# Patient Record
Sex: Male | Born: 1970
Health system: Southern US, Community
[De-identification: ages and names within clinical notes are randomized; demographics above are authoritative.]

## PROBLEM LIST (undated history)

## (undated) DIAGNOSIS — T7840XA Allergy, unspecified, initial encounter: Secondary | ICD-10-CM

## (undated) HISTORY — DX: Allergy, unspecified, initial encounter: T78.40XA

## (undated) HISTORY — PX: VASECTOMY: SHX75

## (undated) HISTORY — PX: SHOULDER SURGERY: SHX246

---

## 2010-07-10 ENCOUNTER — Ambulatory Visit: Payer: Self-pay | Admitting: Sports Medicine

## 2010-07-10 ENCOUNTER — Encounter: Payer: Self-pay | Admitting: Sports Medicine

## 2010-07-10 DIAGNOSIS — M79609 Pain in unspecified limb: Secondary | ICD-10-CM

## 2010-07-10 DIAGNOSIS — Q667 Congenital pes cavus, unspecified foot: Secondary | ICD-10-CM

## 2010-07-10 DIAGNOSIS — M722 Plantar fascial fibromatosis: Secondary | ICD-10-CM | POA: Insufficient documentation

## 2010-07-20 ENCOUNTER — Ambulatory Visit: Payer: Self-pay | Admitting: Sports Medicine

## 2011-01-09 NOTE — Assessment & Plan Note (Signed)
Summary: ORTHOTICS,MC   Vital Signs:  Patient profile:   40 year old male BP sitting:   133 / 85  Vitals Entered By: Lillia Pauls CMA (July 20, 2010 2:17 PM)  History of Present Illness: Brett Cook returns today On last vist we found his chronic PF was very thick on left using arch strap and this has helped doing stretches, exercises and ice wtih some improvement  pain is down another 30%  with his high cavus foot we asked him to RTC for orthotics to see if we can get him back to running  OK on elliptical  Physical Exam  General:  Well-developed,well-nourished,in no acute distress; alert,appropriate and cooperative throughout examination Msk:  HIgh Cavus foot bial Less TTP on left gati shows less foot turnout on left now that he has done exercises midfoot strike with slt forefoot varus on gait   Impression & Recommendations:  Problem # 1:  FOOT PAIN, BILATERAL (ICD-729.5)  improving with Tx plan  Orders: [employer], each unit (L3002)  RTC 2 mos and repeat US  Problem # 2:  PLANTAR FASCIITIS (ICD-728.71)  Patient was fitted for a standard, cushioned, semi-rigid orthotic.  The orthotic was heated and the patient stood on the orthotic blank positioned on the orthotic stand. The patient was positioned in subtalar neutral position and 10 degrees of ankle dorsiflexion in a weight bearing stance. After completion of molding a stable based was applied to the orthotic blank.   The blank was ground to a stable position for weight bearing. size 11 blue swirl base med density large EVA posting none additional orthotic padding none  time 30 mins  use orthotics in workout shoes p 2 wks consider easing into some running  keep up std stretches, exercise and icing  Orders: [employer], each unit (Y7829)  Problem # 3:  TALIPES CAVUS (ICD-754.71)  considering his arch height use other arch supports in all dress shoes  Orders: [employer], each unit  [phone])

## 2011-01-09 NOTE — Assessment & Plan Note (Signed)
Summary: NP-Plantar Fasciitis/bilateral heel pain    Vital Signs:  Patient profile:   40 year old male Height:      72 inches Weight:      221.50 pounds BMI:     30.15 Pulse rate:   65 / minute BP sitting:   135 / 91  (left arm)  Vitals Entered By: Terese Door (July 10, 2010 9:04 AM)  CC: NP-DX with Plantar Fasciitis by Dr. Kendall/Bilateral heel pain      Appended Document: NP-Plantar Fasciitis/bilateral heel pain     History of Present Illness: Brett Cook is a 40 year old male who presents today with a complaint plantar fasciitis, begining in his left foot and now in his right. He has tried conservative therapy including stretches, rolling on ice, NSAIDs, a night boot, Dr. Margart Sickles heel inserts without significant improvement in symptoms. He complains of significant pain with his first steps in the morning and after immobilization in the car or at his desk. He complains of some increasing pain in the region of the fifth left metatarsal region.  Brett Cook was previously an avid distance runner, climber, and skiier, but now limits his physical activity to the elipitical and and bike at the gym (4 times/week) due to pain. He works at a Health and safety inspector job, doing Education officer, environmental for [employer]. He has put on 10 pounds over the past six months due to decreased physical activity.  Past History:  Past Surgical History: Bilateral rotator cuff repair  Physical Exam  General:  Well-developed,well-nourished,in no acute distress; alert,appropriate and cooperative throughout examination Msk:  Long arches are 4cm high, bilaterally, without collapsing.  Tenderness to palpation of the area of plantar fascia insertion into calcaneous, bilaterally.  Walking gait shows normal stride and rhythm.  Running gait shows some eversion and external rotation of the left foot, favoring the lateral aspect.  Slight tenderness to palpation over the left fifth metatarsal without evidence of erythema or  edema. Additional Exam:  MSK [place] There is significant thickening of PF at insertion on Left measuring 0.86 cms There is also some caldification at bone edge RT the PF is mod thickened at 0.67 slight inc calcification as well  images saved   Impression & Recommendations:  Problem # 1:  FOOT PAIN, BILATERAL (ICD-729.5)  this has been chronic now 6 mos  not much relief w std PF program as prescribed by Dr Penni Bombard  Orders: [place] LIMITED [phone])  Problem # 2:  PLANTAR FASCIITIS (ICD-728.71)  This is clearly evident on [place]  prob need to try arch straps as well as set up for custom orthotics  given std PF series of stretches and exercises  Orders: [place] LIMITED ([id number])  Problem # 3:  TALIPES CAVUS (ICD-754.71) these are so high I think we need to get him into custom support for future sports  will rTC in 1 mo  or less for orthotics

## 2012-05-02 ENCOUNTER — Encounter: Payer: Self-pay | Admitting: Family Medicine

## 2012-12-25 ENCOUNTER — Ambulatory Visit (INDEPENDENT_AMBULATORY_CARE_PROVIDER_SITE_OTHER): Payer: BC Managed Care – PPO | Admitting: Emergency Medicine

## 2012-12-25 VITALS — BP 138/89 | HR 89 | Temp 98.1°F | Resp 18 | Wt 230.0 lb

## 2012-12-25 DIAGNOSIS — J020 Streptococcal pharyngitis: Secondary | ICD-10-CM

## 2012-12-25 MED ORDER — AZITHROMYCIN 250 MG PO TABS: ORAL_TABLET | ORAL | Status: DC

## 2012-12-25 NOTE — Progress Notes (Signed)
[facility] [address], [place] [address] [phone]  Date:  12/25/2012   Name:  Brett Cook   DOB:  [birth year]   MRN:  [id number]  PCP:  No primary provider on file.    Chief Complaint: Sore Throat and Fever   History of Present Illness:  [name] is a 42 y.o. very pleasant male patient who presents with the following:  Ill since yesterday with fatigue, sore throat, myalgias, and fever.  Daughter has strep.  No nausea or vomiting.  No cough or coryza.  No wheezing or shortness of breath.  Patient Active Problem List  Diagnosis  . PLANTAR FASCIITIS  . FOOT PAIN, BILATERAL  . TALIPES CAVUS    No past medical history on file.  No past surgical history on file.  History  Substance Use Topics  . Smoking status: Never Smoker   . Smokeless tobacco: Not on file  . Alcohol Use: Not on file    No family history on file.  Allergies  Allergen Reactions  . Penicillins     Medication list has been reviewed and updated.  No current outpatient prescriptions on file prior to visit.    Review of Systems:  As per HPI, otherwise negative.    Physical Examination: Filed Vitals:   12/25/12 1159  BP: 138/89  Pulse: 89  Temp: 98.1 F (36.7 C)  Resp: 18   Filed Vitals:   12/25/12 1159  Weight: 230 lb (104.327 kg)   There is no height on file to calculate BMI. Ideal Body Weight:    GEN: WDWN, NAD, Non-toxic, A & O x 3 HEENT: Atraumatic, Normocephalic. Neck supple. No masses, No LAD.  Oropharynx erythematous Ears and Nose: No external deformity. CV: RRR, No M/G/R. No JVD. No thrill. No extra heart sounds. PULM: CTA B, no wheezes, crackles, rhonchi. No retractions. No resp. distress. No accessory muscle use. ABD: S, NT, ND, +BS. No rebound. No HSM. EXTR: No c/c/e NEURO Normal gait.  PSYCH: Normally interactive. Conversant. Not depressed or anxious appearing.  Calm demeanor.    Assessment and Plan: Strep  throat zithromax Follow up as needed  Carmelina Dane, MD

## 2013-12-05 ENCOUNTER — Ambulatory Visit (INDEPENDENT_AMBULATORY_CARE_PROVIDER_SITE_OTHER): Payer: BC Managed Care – PPO | Admitting: Internal Medicine

## 2013-12-05 VITALS — BP 120/80 | HR 76 | Temp 98.4°F | Resp 16 | Ht 73.0 in | Wt 231.0 lb

## 2013-12-05 DIAGNOSIS — H109 Unspecified conjunctivitis: Secondary | ICD-10-CM

## 2013-12-05 MED ORDER — GENTAMICIN SULFATE 0.3 % OP SOLN: 2.0000 [drp] | OPHTHALMIC | Status: DC

## 2013-12-05 NOTE — Progress Notes (Addendum)
Subjective:  This chart was scribed for Brett Sia, MD by Carl Best, Medical Scribe. This patient was seen in Room 8 and the patient's care was started at 6:35 PM.   Patient ID: Brett Cook, male    DOB: 1970/12/23, 42 y.o.   MRN: [id number]  HPI HPI Comments: Brett Cook is a 42 y.o. male who presents to the [facility] complaining of right eye conjunctivitis.  He states that he had right eye discharge and irritation last night.  He states that his left eye is starting to irritate him now.  He denies sinus pressure and blurred vision as associated symptoms.  He states that he experienced mild sore throat and mild rhinorrhea two days ago.  He states that he used OTC eye drops for his symptoms with no relief.  He states that his wife and daughter had mild conjunctivitis last week.  He states that he is outdoors often this time of the year.     Past Medical History  Diagnosis Date  . Allergy    Past Surgical History  Procedure Laterality Date  . Shoulder surgery     Family History  Problem Relation Age of Onset  . Healthy Mother   . Heart disease Father    History   Social History  . Marital Status: Married    Spouse Name: N/A    Number of Children: N/A  . Years of Education: N/A   Occupational History  . Not on file.   Social History Main Topics  . Smoking status: Never Smoker   . Smokeless tobacco: Not on file  . Alcohol Use: Not on file  . Drug Use: Not on file  . Sexual Activity: Not on file   Other Topics Concern  . Not on file   Social History Narrative  . No narrative on file   Allergies  Allergen Reactions  . Penicillins     Review of Systems A complete 10 system review of systems was obtained and all systems are negative except as noted in the HPI and PMH.    Objective:  Physical Exam  Nursing note and vitals reviewed. Constitutional: He is oriented to person, place, and time. He appears well-developed and  well-nourished. No distress.  HENT:  Head: Normocephalic and atraumatic.  Eyes: Conjunctivae and EOM are normal. Pupils are equal, round, and reactive to light. Right eye exhibits discharge.  Conjunctiva on the right is injected with crusty discharge and lower lid edema but no evidence of abscess  Neck: Neck supple.  Cardiovascular: Normal rate.   Pulmonary/Chest: Effort normal.  Lymphadenopathy:    He has no cervical adenopathy.  Neurological: He is alert and oriented to person, place, and time. No cranial nerve deficit.  Psychiatric: He has a normal mood and affect. His behavior is normal.     BP 120/80  Pulse 76  Temp(Src) 98.4 F (36.9 C) (Oral)  Resp 16  Ht 6\' 1"  (1.854 m)  Wt 231 lb (104.781 kg)  BMI 30.48 kg/m2  SpO2 97%  Assessment & Plan:   I personally performed the services described in this documentation, which was scribed in my presence. The recorded information has been reviewed and is accurate.  Conjunctivitis  Meds ordered this encounter  Medications  . gentamicin (GARAMYCIN) 0.3 % ophthalmic solution    Sig: Place 2 drops into the right eye every 4 (four) hours.    Dispense:  5 mL    Refill:  1

## 2016-03-18 ENCOUNTER — Ambulatory Visit (INDEPENDENT_AMBULATORY_CARE_PROVIDER_SITE_OTHER): Payer: BLUE CROSS/BLUE SHIELD | Admitting: Internal Medicine

## 2016-03-18 VITALS — BP 136/82 | HR 103 | Temp 98.9°F | Resp 20 | Ht 73.5 in | Wt 229.2 lb

## 2016-03-18 DIAGNOSIS — J020 Streptococcal pharyngitis: Secondary | ICD-10-CM | POA: Diagnosis not present

## 2016-03-18 LAB — POCT RAPID STREP A (OFFICE): Rapid Strep A Screen: POSITIVE — AB

## 2016-03-18 MED ORDER — AMOXICILLIN 875 MG PO TABS: 875.0000 mg | ORAL_TABLET | Freq: Two times a day (BID) | ORAL | Status: DC

## 2016-03-18 NOTE — Patient Instructions (Signed)
     IF you received an x-ray today, you will receive an invoice from [facility]. Please contact [facility] at [phone] with questions or concerns regarding your invoice.   IF you received labwork today, you will receive an invoice from [facility]. Please contact [facility] at [phone] with questions or concerns regarding your invoice.   Our billing staff will not be able to assist you with questions regarding bills from these companies.  You will be contacted with the lab results as soon as they are available. The fastest way to get your results is to activate your My Chart account. Instructions are located on the last page of this paperwork. If you have not heard from us regarding the results in 2 weeks, please contact this office.      

## 2016-03-18 NOTE — Progress Notes (Signed)
Subjective:  By signing my name below, I, [name], attest that this documentation has been prepared under the direction and in the presence of Ellamae Siaobert Isaish Alemu, MD. Electronically Signed: [name], Scribe. 03/18/2016 , 10:59 AM .  Patient was seen in Room 9 .   Patient ID: Brett Cook, male    DOB: 10/05/1971, 45 y.o.   MRN: [id number] Chief Complaint  Patient presents with  . Sore Throat    started over night.  painful to swallow  . Sinusitis    sinus pressure that started yesterday   HPI Brett Cook is a 45 y.o. male who presents to [facility] complaining of sore throat and sinus pressure that started last night. He mentions his children's classmates having strep. He also informs being out in the woods with his son yesterday afternoon. He does have seasonal allergies. He noticed rhinorrhea and dry eyes yesterday afternoon. Today, he's noticed cervical lymph nodes feeling swollen and pain when swallowing. He states taking a 20 minute hot shower this morning for nasal congestion relief. He denies fever or cough.   Patient Active Problem List   Diagnosis Date Noted  . PLANTAR FASCIITIS 07/10/2010  . FOOT PAIN, BILATERAL 07/10/2010  . TALIPES CAVUS 07/10/2010    Current outpatient prescriptions:  .  gentamicin (GARAMYCIN) 0.3 % ophthalmic solution, Place 2 drops into the right eye every 4 (four) hours. (Patient not taking: Reported on 03/18/2016), Disp: 5 mL, Rfl: 1  Review of Systems  Constitutional: Negative for fever, chills and fatigue.  HENT: Positive for congestion, rhinorrhea, sinus pressure, sore throat and trouble swallowing.   Eyes:       Eye dryness  Respiratory: Negative for cough.   Gastrointestinal: Negative for nausea, vomiting and diarrhea.  Allergic/Immunologic: Positive for environmental allergies.  Hematological: Positive for adenopathy.      Objective:   Physical Exam  Constitutional: He is oriented to person, place, and time. He appears  well-developed and well-nourished. No distress.  HENT:  Head: Normocephalic and atraumatic.  Nose: Rhinorrhea (purulent) present.  Mouth/Throat: Oropharyngeal exudate (scant) and posterior oropharyngeal erythema present.  Eyes: EOM are normal. Pupils are equal, round, and reactive to light.  Neck: Neck supple.  Cardiovascular: Normal rate.   Pulmonary/Chest: Effort normal and breath sounds normal. No respiratory distress. He has no wheezes.  Musculoskeletal: Normal range of motion.  Lymphadenopathy:    He has cervical adenopathy (anterior).  Neurological: He is alert and oriented to person, place, and time.  Skin: Skin is warm and dry.  Psychiatric: He has a normal mood and affect. His behavior is normal.  Nursing note and vitals reviewed.  BP 136/82 mmHg  Pulse 103  Temp(Src) 98.9 F (37.2 C) (Oral)  Resp 20  Ht 6' 1.5" (1.867 m)  Wt 229 lb 4 oz (103.987 kg)  BMI 29.83 kg/m2  SpO2 98%  Results for orders placed or performed in visit on 03/18/16  POCT rapid strep A  Result Value Ref Range   Rapid Strep A Screen Positive (A) Negative      Assessment & Plan:  I have completed the patient encounter in its entirety as documented by the scribe, with editing by me where necessary. Machael Raine P. Merla Richesoolittle, M.D. Strep pharyngitis - Plan: POCT rapid strep A  Meds ordered this encounter  Medications  . amoxicillin (AMOXIL) 875 MG tablet    Sig: Take 1 tablet (875 mg total) by mouth 2 (two) times daily.    Dispense:  20 tablet  Refill:  0   Allergic to Pen but tolerates amox without trouble

## 2018-07-14 DIAGNOSIS — M542 Cervicalgia: Secondary | ICD-10-CM | POA: Diagnosis not present

## 2018-07-14 DIAGNOSIS — M9903 Segmental and somatic dysfunction of lumbar region: Secondary | ICD-10-CM | POA: Diagnosis not present

## 2018-07-14 DIAGNOSIS — M5412 Radiculopathy, cervical region: Secondary | ICD-10-CM | POA: Diagnosis not present

## 2018-07-14 DIAGNOSIS — M9901 Segmental and somatic dysfunction of cervical region: Secondary | ICD-10-CM | POA: Diagnosis not present

## 2018-07-21 DIAGNOSIS — M9903 Segmental and somatic dysfunction of lumbar region: Secondary | ICD-10-CM | POA: Diagnosis not present

## 2018-07-21 DIAGNOSIS — M542 Cervicalgia: Secondary | ICD-10-CM | POA: Diagnosis not present

## 2018-07-21 DIAGNOSIS — M9901 Segmental and somatic dysfunction of cervical region: Secondary | ICD-10-CM | POA: Diagnosis not present

## 2018-07-21 DIAGNOSIS — M5412 Radiculopathy, cervical region: Secondary | ICD-10-CM | POA: Diagnosis not present

## 2018-07-23 DIAGNOSIS — M9903 Segmental and somatic dysfunction of lumbar region: Secondary | ICD-10-CM | POA: Diagnosis not present

## 2018-07-23 DIAGNOSIS — M9901 Segmental and somatic dysfunction of cervical region: Secondary | ICD-10-CM | POA: Diagnosis not present

## 2018-07-23 DIAGNOSIS — M5412 Radiculopathy, cervical region: Secondary | ICD-10-CM | POA: Diagnosis not present

## 2018-07-23 DIAGNOSIS — M542 Cervicalgia: Secondary | ICD-10-CM | POA: Diagnosis not present

## 2018-07-30 DIAGNOSIS — M9901 Segmental and somatic dysfunction of cervical region: Secondary | ICD-10-CM | POA: Diagnosis not present

## 2018-07-30 DIAGNOSIS — M542 Cervicalgia: Secondary | ICD-10-CM | POA: Diagnosis not present

## 2018-07-30 DIAGNOSIS — M5412 Radiculopathy, cervical region: Secondary | ICD-10-CM | POA: Diagnosis not present

## 2018-07-30 DIAGNOSIS — M9903 Segmental and somatic dysfunction of lumbar region: Secondary | ICD-10-CM | POA: Diagnosis not present

## 2018-08-04 DIAGNOSIS — M542 Cervicalgia: Secondary | ICD-10-CM | POA: Diagnosis not present

## 2018-08-04 DIAGNOSIS — M9901 Segmental and somatic dysfunction of cervical region: Secondary | ICD-10-CM | POA: Diagnosis not present

## 2018-08-04 DIAGNOSIS — M5412 Radiculopathy, cervical region: Secondary | ICD-10-CM | POA: Diagnosis not present

## 2018-08-04 DIAGNOSIS — M9903 Segmental and somatic dysfunction of lumbar region: Secondary | ICD-10-CM | POA: Diagnosis not present

## 2018-08-06 DIAGNOSIS — M9901 Segmental and somatic dysfunction of cervical region: Secondary | ICD-10-CM | POA: Diagnosis not present

## 2018-08-06 DIAGNOSIS — M9903 Segmental and somatic dysfunction of lumbar region: Secondary | ICD-10-CM | POA: Diagnosis not present

## 2018-08-06 DIAGNOSIS — M542 Cervicalgia: Secondary | ICD-10-CM | POA: Diagnosis not present

## 2018-08-06 DIAGNOSIS — M5412 Radiculopathy, cervical region: Secondary | ICD-10-CM | POA: Diagnosis not present

## 2018-08-13 DIAGNOSIS — M9903 Segmental and somatic dysfunction of lumbar region: Secondary | ICD-10-CM | POA: Diagnosis not present

## 2018-08-13 DIAGNOSIS — M9901 Segmental and somatic dysfunction of cervical region: Secondary | ICD-10-CM | POA: Diagnosis not present

## 2018-08-13 DIAGNOSIS — M542 Cervicalgia: Secondary | ICD-10-CM | POA: Diagnosis not present

## 2018-08-13 DIAGNOSIS — M5412 Radiculopathy, cervical region: Secondary | ICD-10-CM | POA: Diagnosis not present

## 2018-09-10 DIAGNOSIS — M542 Cervicalgia: Secondary | ICD-10-CM | POA: Diagnosis not present

## 2018-09-10 DIAGNOSIS — M5412 Radiculopathy, cervical region: Secondary | ICD-10-CM | POA: Diagnosis not present

## 2018-09-10 DIAGNOSIS — M9903 Segmental and somatic dysfunction of lumbar region: Secondary | ICD-10-CM | POA: Diagnosis not present

## 2018-09-10 DIAGNOSIS — M9901 Segmental and somatic dysfunction of cervical region: Secondary | ICD-10-CM | POA: Diagnosis not present

## 2018-09-17 DIAGNOSIS — M9901 Segmental and somatic dysfunction of cervical region: Secondary | ICD-10-CM | POA: Diagnosis not present

## 2018-09-17 DIAGNOSIS — M5412 Radiculopathy, cervical region: Secondary | ICD-10-CM | POA: Diagnosis not present

## 2018-09-17 DIAGNOSIS — M9903 Segmental and somatic dysfunction of lumbar region: Secondary | ICD-10-CM | POA: Diagnosis not present

## 2018-09-17 DIAGNOSIS — M542 Cervicalgia: Secondary | ICD-10-CM | POA: Diagnosis not present

## 2018-09-22 DIAGNOSIS — M9901 Segmental and somatic dysfunction of cervical region: Secondary | ICD-10-CM | POA: Diagnosis not present

## 2018-09-22 DIAGNOSIS — M5412 Radiculopathy, cervical region: Secondary | ICD-10-CM | POA: Diagnosis not present

## 2018-09-22 DIAGNOSIS — M542 Cervicalgia: Secondary | ICD-10-CM | POA: Diagnosis not present

## 2018-09-22 DIAGNOSIS — M9903 Segmental and somatic dysfunction of lumbar region: Secondary | ICD-10-CM | POA: Diagnosis not present

## 2019-01-05 DIAGNOSIS — L218 Other seborrheic dermatitis: Secondary | ICD-10-CM | POA: Diagnosis not present

## 2019-01-05 DIAGNOSIS — L400 Psoriasis vulgaris: Secondary | ICD-10-CM | POA: Diagnosis not present

## 2020-01-06 ENCOUNTER — Ambulatory Visit: Payer: BLUE CROSS/BLUE SHIELD | Attending: Internal Medicine

## 2020-01-06 DIAGNOSIS — Z20822 Contact with and (suspected) exposure to covid-19: Secondary | ICD-10-CM

## 2020-01-07 LAB — NOVEL CORONAVIRUS, NAA: SARS-CoV-2, NAA: NOT DETECTED

## 2020-04-29 DIAGNOSIS — M5412 Radiculopathy, cervical region: Secondary | ICD-10-CM | POA: Diagnosis not present

## 2020-04-29 DIAGNOSIS — M25511 Pain in right shoulder: Secondary | ICD-10-CM | POA: Diagnosis not present

## 2020-05-03 DIAGNOSIS — M5030 Other cervical disc degeneration, unspecified cervical region: Secondary | ICD-10-CM | POA: Diagnosis not present

## 2020-05-18 ENCOUNTER — Ambulatory Visit (INDEPENDENT_AMBULATORY_CARE_PROVIDER_SITE_OTHER): Payer: BC Managed Care – PPO | Admitting: Registered Nurse

## 2020-05-18 ENCOUNTER — Other Ambulatory Visit: Payer: Self-pay

## 2020-05-18 ENCOUNTER — Encounter: Payer: Self-pay | Admitting: Registered Nurse

## 2020-05-18 VITALS — BP 117/74 | HR 71 | Temp 97.9°F | Resp 17 | Ht 72.6 in | Wt 228.6 lb

## 2020-05-18 DIAGNOSIS — Z1159 Encounter for screening for other viral diseases: Secondary | ICD-10-CM | POA: Diagnosis not present

## 2020-05-18 DIAGNOSIS — R202 Paresthesia of skin: Secondary | ICD-10-CM

## 2020-05-18 DIAGNOSIS — Z1329 Encounter for screening for other suspected endocrine disorder: Secondary | ICD-10-CM | POA: Diagnosis not present

## 2020-05-18 DIAGNOSIS — Z0001 Encounter for general adult medical examination with abnormal findings: Secondary | ICD-10-CM | POA: Diagnosis not present

## 2020-05-18 DIAGNOSIS — Z13220 Encounter for screening for lipoid disorders: Secondary | ICD-10-CM | POA: Diagnosis not present

## 2020-05-18 DIAGNOSIS — Z13228 Encounter for screening for other metabolic disorders: Secondary | ICD-10-CM | POA: Diagnosis not present

## 2020-05-18 DIAGNOSIS — Z7689 Persons encountering health services in other specified circumstances: Secondary | ICD-10-CM

## 2020-05-18 DIAGNOSIS — Z0000 Encounter for general adult medical examination without abnormal findings: Secondary | ICD-10-CM

## 2020-05-18 DIAGNOSIS — Z23 Encounter for immunization: Secondary | ICD-10-CM | POA: Diagnosis not present

## 2020-05-18 DIAGNOSIS — Z130 Encounter for screening for diseases of the blood and blood-forming organs and certain disorders involving the immune mechanism: Secondary | ICD-10-CM

## 2020-05-18 DIAGNOSIS — H61893 Other specified disorders of external ear, bilateral: Secondary | ICD-10-CM

## 2020-05-18 MED ORDER — HYDROCORTISONE-ACETIC ACID 1-2 % OT SOLN: 4.0000 [drp] | Freq: Three times a day (TID) | OTIC | 0 refills | Status: DC

## 2020-05-18 NOTE — Patient Instructions (Signed)
° ° ° °  If you have lab work done today you will be contacted with your lab results within the next 2 weeks.  If you have not heard from us then please contact us. The fastest way to get your results is to register for My Chart. ° ° °IF you received an x-ray today, you will receive an invoice from [facility]. Please contact [facility] at [phone] with questions or concerns regarding your invoice.  ° °IF you received labwork today, you will receive an invoice from [facility]. Please contact [facility] at [phone] with questions or concerns regarding your invoice.  ° °Our billing staff will not be able to assist you with questions regarding bills from these companies. ° °You will be contacted with the lab results as soon as they are available. The fastest way to get your results is to activate your My Chart account. Instructions are located on the last page of this paperwork. If you have not heard from us regarding the results in 2 weeks, please contact this office. °  ° ° ° °

## 2020-05-19 LAB — VITAMIN D 25 HYDROXY (VIT D DEFICIENCY, FRACTURES): Vit D, 25-Hydroxy: 20.1 ng/mL — ABNORMAL LOW (ref 30.0–100.0)

## 2020-05-19 LAB — LIPID PANEL
Chol/HDL Ratio: 5.5 ratio — ABNORMAL HIGH (ref 0.0–5.0)
Cholesterol, Total: 225 mg/dL — ABNORMAL HIGH (ref 100–199)
HDL: 41 mg/dL (ref >39)
LDL Chol Calc (NIH): 115 mg/dL — ABNORMAL HIGH (ref 0–99)
Triglycerides: 399 mg/dL — ABNORMAL HIGH (ref 0–149)
VLDL Cholesterol Cal: 69 mg/dL — ABNORMAL HIGH (ref 5–40)

## 2020-05-19 LAB — COMPREHENSIVE METABOLIC PANEL
ALT: 55 IU/L — ABNORMAL HIGH (ref 0–44)
AST: 34 IU/L (ref 0–40)
Albumin/Globulin Ratio: 1.9 (ref 1.2–2.2)
Albumin: 4.8 g/dL (ref 4.0–5.0)
Alkaline Phosphatase: 74 IU/L (ref 48–121)
BUN/Creatinine Ratio: 20 (ref 9–20)
BUN: 20 mg/dL (ref 6–24)
Bilirubin Total: 0.4 mg/dL (ref 0.0–1.2)
CO2: 22 mmol/L (ref 20–29)
Calcium: 9.5 mg/dL (ref 8.7–10.2)
Chloride: 102 mmol/L (ref 96–106)
Creatinine, Ser: 0.98 mg/dL (ref 0.76–1.27)
GFR calc Af Amer: 104 mL/min/{1.73_m2} (ref >59)
GFR calc non Af Amer: 90 mL/min/{1.73_m2} (ref >59)
Globulin, Total: 2.5 g/dL (ref 1.5–4.5)
Glucose: 92 mg/dL (ref 65–99)
Potassium: 4.4 mmol/L (ref 3.5–5.2)
Sodium: 138 mmol/L (ref 134–144)
Total Protein: 7.3 g/dL (ref 6.0–8.5)

## 2020-05-19 LAB — CBC WITH DIFFERENTIAL/PLATELET
Basophils Absolute: 0.1 10*3/uL (ref 0.0–0.2)
Basos: 1 %
EOS (ABSOLUTE): 0.1 10*3/uL (ref 0.0–0.4)
Eos: 2 %
Hematocrit: 44.2 % (ref 37.5–51.0)
Hemoglobin: 15.2 g/dL (ref 13.0–17.7)
Immature Grans (Abs): 0 10*3/uL (ref 0.0–0.1)
Immature Granulocytes: 0 %
Lymphocytes Absolute: 2.6 10*3/uL (ref 0.7–3.1)
Lymphs: 32 %
MCH: 31.9 pg (ref 26.6–33.0)
MCHC: 34.4 g/dL (ref 31.5–35.7)
MCV: 93 fL (ref 79–97)
Monocytes Absolute: 0.8 10*3/uL (ref 0.1–0.9)
Monocytes: 10 %
Neutrophils Absolute: 4.5 10*3/uL (ref 1.4–7.0)
Neutrophils: 55 %
Platelets: 225 10*3/uL (ref 150–450)
RBC: 4.76 x10E6/uL (ref 4.14–5.80)
RDW: 12.3 % (ref 11.6–15.4)
WBC: 8.1 10*3/uL (ref 3.4–10.8)

## 2020-05-19 LAB — HIV ANTIBODY (ROUTINE TESTING W REFLEX): HIV Screen 4th Generation wRfx: NONREACTIVE

## 2020-05-19 LAB — MAGNESIUM: Magnesium: 2.1 mg/dL (ref 1.6–2.3)

## 2020-05-19 LAB — HEMOGLOBIN A1C
Est. average glucose Bld gHb Est-mCnc: 105 mg/dL
Hgb A1c MFr Bld: 5.3 % (ref 4.8–5.6)

## 2020-05-19 LAB — VITAMIN B12: Vitamin B-12: 917 pg/mL (ref 232–1245)

## 2020-05-19 LAB — HEPATITIS C ANTIBODY: Hep C Virus Ab: <0.1 s/co ratio (ref 0.0–0.9)

## 2020-05-19 LAB — TSH: TSH: 0.831 u[IU]/mL (ref 0.450–4.500)

## 2020-05-27 ENCOUNTER — Other Ambulatory Visit: Payer: Self-pay | Admitting: Registered Nurse

## 2020-05-27 DIAGNOSIS — E782 Mixed hyperlipidemia: Secondary | ICD-10-CM

## 2020-05-27 MED ORDER — ATORVASTATIN CALCIUM 40 MG PO TABS: 40.0000 mg | ORAL_TABLET | Freq: Every day | ORAL | 3 refills | Status: DC

## 2020-06-10 ENCOUNTER — Encounter: Payer: Self-pay | Admitting: Registered Nurse

## 2020-06-10 NOTE — Telephone Encounter (Signed)
Yes - pt would need visit  Thank you  Jari Sportsman, NP

## 2020-06-10 NOTE — Telephone Encounter (Signed)
[place] had many tick bites over the years wants to know if he could get a lyme test as he is concerned its contributed to his aches and pains.   Does he need a separate appt for this or can he come have the lab drawn without a visit?

## 2020-06-14 DIAGNOSIS — M5030 Other cervical disc degeneration, unspecified cervical region: Secondary | ICD-10-CM | POA: Diagnosis not present

## 2020-06-15 ENCOUNTER — Telehealth: Payer: Self-pay | Admitting: Registered Nurse

## 2020-06-15 NOTE — Telephone Encounter (Signed)
Pt called and made an appt to get his blood drown for lyme disease. Appt is tomorrow 06/16/20 Needing orders in for this. Please advise.

## 2020-06-15 NOTE — Telephone Encounter (Signed)
Please schedule patient for an appt with a provider in order for the insurance to cover the lyme disease test. At that time provider will order the labs . thanks

## 2020-06-16 ENCOUNTER — Ambulatory Visit: Payer: BC Managed Care – PPO

## 2020-06-16 ENCOUNTER — Encounter: Payer: Self-pay | Admitting: Family Medicine

## 2020-06-16 ENCOUNTER — Other Ambulatory Visit: Payer: Self-pay

## 2020-06-16 ENCOUNTER — Ambulatory Visit (INDEPENDENT_AMBULATORY_CARE_PROVIDER_SITE_OTHER): Payer: BC Managed Care – PPO | Admitting: Family Medicine

## 2020-06-16 VITALS — BP 128/80 | HR 68 | Temp 97.6°F | Resp 15 | Ht 72.0 in | Wt 232.0 lb

## 2020-06-16 DIAGNOSIS — W57XXXA Bitten or stung by nonvenomous insect and other nonvenomous arthropods, initial encounter: Secondary | ICD-10-CM | POA: Diagnosis not present

## 2020-06-16 DIAGNOSIS — R202 Paresthesia of skin: Secondary | ICD-10-CM | POA: Diagnosis not present

## 2020-06-16 DIAGNOSIS — E782 Mixed hyperlipidemia: Secondary | ICD-10-CM

## 2020-06-16 MED ORDER — ATORVASTATIN CALCIUM 40 MG PO TABS: 40.0000 mg | ORAL_TABLET | Freq: Every day | ORAL | 3 refills | Status: DC

## 2020-06-16 MED ORDER — DOXYCYCLINE HYCLATE 100 MG PO TABS: ORAL_TABLET | ORAL | 0 refills | Status: DC

## 2020-06-16 NOTE — Telephone Encounter (Signed)
Patient have been schedule ov visit with Dr hopper in order to get lymes test due to tick bites

## 2020-06-16 NOTE — Telephone Encounter (Signed)
Pt returning call. Please advise. °

## 2020-06-16 NOTE — Progress Notes (Signed)
Patient ID: Brett Cook, male    DOB: [birth year]  Age: 49 y.o. MRN: [id number]  Chief Complaint  Patient presents with  . tick bites    pt spends a lot of time in the woods, recent fatigue and is concerned about lyme disease.  . Numbness    pt is having numbness in both hands and the Rt arm, started about 3-4 years ago     Subjective:   Patient is here because he has gotten a lot of tick bites.  He gets them regularly [place].  He usually gets them off within a couple of days he thinks.  He has had problems with numbness in his left hand and his right arm.  He sees orthopedics for possible CTS.  Not ready for surgery.  He also has numbness in the right thigh over the last year.  Current allergies, medications, problem list, past/family and social histories reviewed.  Objective:  BP 128/80   Pulse 68   Temp 97.6 F (36.4 C) (Temporal)   Resp 15   Ht 6' (1.829 m)   Wt 232 lb (105.2 kg)   SpO2 97%   BMI 31.46 kg/m   Clinically healthy appearing.  Assessment & Plan:   Assessment: 1. Tick bite, initial encounter   2. Mixed hyperlipidemia   3. Paresthesia of right leg       Plan: See instructions.  Decided to go ahead and get a neurologist to check in for that thigh numbness  Orders Placed This Encounter  Procedures  . B. burgdorfi antibodies  . Ambulatory referral to Neurology    Referral Priority:   Routine    Referral Type:   Consultation    Referral Reason:   Specialty Services Required    Referred to Provider:   Melvyn Novas, MD    Requested Specialty:   Neurology    Number of Visits Requested:   1    Meds ordered this encounter  Medications  . atorvastatin (LIPITOR) 40 MG tablet    Sig: Take 1 tablet (40 mg total) by mouth daily.    Dispense:  90 tablet    Refill:  3  . doxycycline (VIBRA-TABS) 100 MG tablet    Sig: Take 2 pills single dose after removing attached tick.  Save for future bites.    Dispense:  20  tablet    Refill:  0         Patient Instructions    In the event of tick bite, take 200 mg of doxycycline single dose.   Testing is being performed today.  We will let you know the results of that.  Referral is being made to neurology to evaluate your numbness, especially regarding the right thigh.  I have requested [facility] Dr. Vickey Huger if she can see you.  Return as needed  Keep working on weight loss and exercise and begin taking your lipid-lowering medication.  It may help the triglycerides some also.  Follow-up with Janeece Agee as directed by him or in a few months.   If you have lab work done today you will be contacted with your lab results within the next 2 weeks.  If you have not heard from [place] then please contact us. The fastest way to get your results is to register for My Chart.   IF you received an x-ray today, you will receive an invoice from [facility]. Please contact [facility] at [phone]  with questions or concerns regarding your invoice.   IF you received labwork today, you will receive an invoice from [facility]. Please contact [facility] at [phone] with questions or concerns regarding your invoice.   Our billing staff will not be able to assist you with questions regarding bills from these companies.  You will be contacted with the lab results as soon as they are available. The fastest way to get your results is to activate your My Chart account. Instructions are located on the last page of this paperwork. If you have not heard from [place] regarding the results in 2 weeks, please contact this office.        Return if symptoms worsen or fail to improve.   Janace Hoard, MD 06/16/2020

## 2020-06-16 NOTE — Patient Instructions (Addendum)
  In the event of tick bite, take 200 mg of doxycycline single dose.   Testing is being performed today.  We will let you know the results of that.  Referral is being made to neurology to evaluate your numbness, especially regarding the right thigh.  I have requested [facility] Dr. Vickey Huger if she can see you.  Return as needed  Keep working on weight loss and exercise and begin taking your lipid-lowering medication.  It may help the triglycerides some also.  Follow-up with Janeece Agee as directed by him or in a few months.   If you have lab work done today you will be contacted with your lab results within the next 2 weeks.  If you have not heard from [place] then please contact us. The fastest way to get your results is to register for My Chart.   IF you received an x-ray today, you will receive an invoice from [facility]. Please contact [facility] at [phone] with questions or concerns regarding your invoice.   IF you received labwork today, you will receive an invoice from [facility]. Please contact [facility] at [phone] with questions or concerns regarding your invoice.   Our billing staff will not be able to assist you with questions regarding bills from these companies.  You will be contacted with the lab results as soon as they are available. The fastest way to get your results is to activate your My Chart account. Instructions are located on the last page of this paperwork. If you have not heard from [place] regarding the results in 2 weeks, please contact this office.

## 2020-06-16 NOTE — Telephone Encounter (Signed)
LVMTCB for pt to sch his appt with a provider. Pt is sch for today at 2 for a nurse visit.

## 2020-06-20 DIAGNOSIS — H10413 Chronic giant papillary conjunctivitis, bilateral: Secondary | ICD-10-CM | POA: Diagnosis not present

## 2020-06-20 DIAGNOSIS — H31091 Other chorioretinal scars, right eye: Secondary | ICD-10-CM | POA: Diagnosis not present

## 2020-06-22 LAB — LYME, WESTERN BLOT, SERUM (REFLEXED)
IgG P18 Ab.: ABSENT
IgG P23 Ab.: ABSENT
IgG P28 Ab.: ABSENT
IgG P30 Ab.: ABSENT
IgG P39 Ab.: ABSENT
IgG P45 Ab.: ABSENT
IgG P66 Ab.: ABSENT
IgG P93 Ab.: ABSENT
IgM P23 Ab.: ABSENT
IgM P39 Ab.: ABSENT
IgM P41 Ab.: ABSENT
Lyme IgG Wb: NEGATIVE
Lyme IgM Wb: NEGATIVE

## 2020-06-22 LAB — B. BURGDORFI ANTIBODIES: Lyme IgG/IgM Ab: 0.98 {ISR} — ABNORMAL HIGH (ref 0.00–0.90)

## 2020-06-23 ENCOUNTER — Other Ambulatory Visit: Payer: Self-pay | Admitting: Family Medicine

## 2020-06-23 DIAGNOSIS — A6920 Lyme disease, unspecified: Secondary | ICD-10-CM

## 2020-06-23 NOTE — Progress Notes (Signed)
Will refer on to infectious disease to follow-up on him in case any further assessment or treatment is needed.  Sandria Bales. Alwyn Ren, MD

## 2020-06-23 NOTE — Progress Notes (Signed)
Lyme test came back positive.  Will treat with Doxycycline 100 mg twice daiy for 10 days.    I am referring to infectious disease to see if any further follow up needed.

## 2020-06-28 DIAGNOSIS — M5030 Other cervical disc degeneration, unspecified cervical region: Secondary | ICD-10-CM | POA: Diagnosis not present

## 2020-06-28 DIAGNOSIS — M542 Cervicalgia: Secondary | ICD-10-CM | POA: Diagnosis not present

## 2020-07-11 DIAGNOSIS — M5412 Radiculopathy, cervical region: Secondary | ICD-10-CM | POA: Diagnosis not present

## 2020-07-12 ENCOUNTER — Other Ambulatory Visit: Payer: Self-pay

## 2020-07-12 ENCOUNTER — Encounter: Payer: Self-pay | Admitting: Internal Medicine

## 2020-07-12 ENCOUNTER — Ambulatory Visit (INDEPENDENT_AMBULATORY_CARE_PROVIDER_SITE_OTHER): Payer: BC Managed Care – PPO | Admitting: Internal Medicine

## 2020-07-12 DIAGNOSIS — T148XXA Other injury of unspecified body region, initial encounter: Secondary | ICD-10-CM | POA: Diagnosis not present

## 2020-07-12 DIAGNOSIS — W57XXXA Bitten or stung by nonvenomous insect and other nonvenomous arthropods, initial encounter: Secondary | ICD-10-CM

## 2020-07-12 NOTE — Progress Notes (Signed)
    [facility]      Reason for Consult: tick exposure    Referring Physician: Dr. Alwyn Ren    Patient ID: Brett Cook, male    DOB: Dec 20, 1970, 49 y.o.   MRN: [id number]  HPI:   He is here for evaluation of recent tick exposures and concern for Lyme disease.  He is being evaluated for numbness and tingling and part of the work up included Lyme disease testing.  He has no history of an EM rash, no facial droop.  He has a cabin in [place] he spends a lot of time at.  He was treated for 10 days with doxycycline.  Lyme WB is negative for Lyme disease.     Past Medical History:  Diagnosis Date  . Allergy     Prior to Admission medications   Medication Sig Start Date End Date Taking? Authorizing Provider  atorvastatin (LIPITOR) 40 MG tablet Take 1 tablet (40 mg total) by mouth daily. 06/16/20  Yes Janeece Agee, NP  acetic acid-hydrocortisone (VOSOL-HC) OTIC solution Place 4 drops into both ears 3 (three) times daily. Patient not taking: Reported on 07/12/2020 05/18/20   Janeece Agee, NP  doxycycline (VIBRA-TABS) 100 MG tablet Take 2 pills single dose after removing attached tick.  Save for future bites. Patient not taking: Reported on 07/12/2020 06/16/20   Peyton Najjar, MD    Allergies  Allergen Reactions  . Penicillins     Swelling---happened years ago    Social History   Tobacco Use  . Smoking status: Never Smoker  . Smokeless tobacco: Never Used  Substance Use Topics  . Alcohol use: Yes    Alcohol/week: 0.0 standard drinks    Comment: social use  . Drug use: No    Family History  Problem Relation Age of Onset  . Healthy Mother   . Heart disease Father     Review of Systems  Constitutional: negative for fevers and chills Gastrointestinal: negative for diarrhea All other systems reviewed and are negative    Constitutional: in no apparent distress  Vitals:   07/12/20 1004  BP: 127/80  Pulse: 65  Temp: 98.1 F (36.7 C)   EYES:  anicteric Skin: negatives: no edema Neuro: non-focal  Labs: Lab Results  Component Value Date   WBC 8.1 05/18/2020   HGB 15.2 05/18/2020   HCT 44.2 05/18/2020   MCV 93 05/18/2020   PLT 225 05/18/2020    Lab Results  Component Value Date   CREATININE 0.98 05/18/2020   BUN 20 05/18/2020   NA 138 05/18/2020   K 4.4 05/18/2020   CL 102 05/18/2020   CO2 22 05/18/2020    Lab Results  Component Value Date   ALT 55 (H) 05/18/2020   AST 34 05/18/2020   ALKPHOS 74 05/18/2020   BILITOT 0.4 05/18/2020     Assessment: Concern for Lyme disease.  No previous symptoms specific to Lyme disease with no EM rash, no facial droop.  Western Blot also negative for lyme disease.   No further evaluation indicated.   Plan: 1) continue supportive care

## 2020-07-14 ENCOUNTER — Other Ambulatory Visit: Payer: Self-pay | Admitting: Physical Medicine and Rehabilitation

## 2020-07-14 DIAGNOSIS — E049 Nontoxic goiter, unspecified: Secondary | ICD-10-CM

## 2020-07-19 DIAGNOSIS — M5030 Other cervical disc degeneration, unspecified cervical region: Secondary | ICD-10-CM | POA: Diagnosis not present

## 2020-07-19 DIAGNOSIS — R200 Anesthesia of skin: Secondary | ICD-10-CM | POA: Diagnosis not present

## 2020-07-27 ENCOUNTER — Other Ambulatory Visit: Payer: BC Managed Care – PPO

## 2020-07-29 ENCOUNTER — Ambulatory Visit: Payer: BC Managed Care – PPO | Admitting: Neurology

## 2020-08-03 ENCOUNTER — Ambulatory Visit
Admission: RE | Admit: 2020-08-03 | Discharge: 2020-08-03 | Disposition: A | Discharge status: Home / Self Care | Payer: BC Managed Care – PPO | Source: Ambulatory Visit | Attending: Physical Medicine and Rehabilitation | Admitting: Physical Medicine and Rehabilitation

## 2020-08-03 DIAGNOSIS — E049 Nontoxic goiter, unspecified: Secondary | ICD-10-CM

## 2020-08-03 DIAGNOSIS — E010 Iodine-deficiency related diffuse (endemic) goiter: Secondary | ICD-10-CM | POA: Diagnosis not present

## 2020-08-24 DIAGNOSIS — G5603 Carpal tunnel syndrome, bilateral upper limbs: Secondary | ICD-10-CM | POA: Diagnosis not present

## 2020-08-29 ENCOUNTER — Encounter: Payer: Self-pay | Admitting: Registered Nurse

## 2020-08-29 NOTE — Progress Notes (Signed)
New Patient Office Visit  Subjective:  Patient ID: Brett Cook, male    DOB: Feb 06, 1971  Age: 49 y.o. MRN: [id number]  CC:  Chief Complaint  Patient presents with   New Patient (Initial Visit)    establish care and CPE    HPI Brett Cook presents for visit to establish care and CPE  Notes some itching in ear canals. No drainage. Some dry skin. No changes to hearing. No pain, no upper respiratory symptoms.  Does note some bilateral paresthesias up upper extremities. Worst in hands. Has not happened before. Works with hands frequently. Slow onset. No hx of acute injury. This has not happened before.  Otherwise feeling well and is without complaint  Past Medical History:  Diagnosis Date   Allergy     Past Surgical History:  Procedure Laterality Date   SHOULDER SURGERY     VASECTOMY N/A    [employer]reesia 05/16/2020    Family History  Problem Relation Age of Onset   Healthy Mother    Heart disease Father     Social History   Socioeconomic History   Marital status: Married    Spouse name: Not on file   Number of children: Not on file   Years of education: Not on file   Highest education level: Not on file  Occupational History   Not on file  Tobacco Use   Smoking status: Never Smoker   Smokeless tobacco: Never Used  Substance and Sexual Activity   Alcohol use: Yes    Alcohol/week: 0.0 standard drinks    Comment: social use   Drug use: No   Sexual activity: Yes  Other Topics Concern   Not on file  Social History Narrative   Not on file   Social Determinants of Health   Financial Resource Strain:    Difficulty of Paying Living Expenses: Not on file  Food Insecurity:    Worried About Running Out of Food in the Last Year: Not on file   Ran Out of Food in the Last Year: Not on file  Transportation Needs:    Lack of Transportation (Medical): Not on file   Lack of Transportation (Non-Medical): Not on file  Physical Activity:    Days  of Exercise per Week: Not on file   Minutes of Exercise per Session: Not on file  Stress:    Feeling of Stress : Not on file  Social Connections:    Frequency of Communication with Friends and Family: Not on file   Frequency of Social Gatherings with Friends and Family: Not on file   Attends Religious Services: Not on file   Active Member of Clubs or Organizations: Not on file   Attends Banker Meetings: Not on file   Marital Status: Not on file  Intimate Partner Violence:    Fear of Current or Ex-Partner: Not on file   Emotionally Abused: Not on file   Physically Abused: Not on file   Sexually Abused: Not on file    ROS Review of Systems  Constitutional: Negative.   HENT: Negative.   Eyes: Negative.   Respiratory: Negative.   Cardiovascular: Negative.   Gastrointestinal: Negative.   Genitourinary: Negative.   Musculoskeletal: Negative.   Skin: Negative.   Neurological: Positive for numbness.  Psychiatric/Behavioral: Negative.     Objective:   Today's Vitals: BP 117/74    Pulse 71    Temp 97.9 F (36.6 C) (Temporal)    Resp 17    Ht 6'  0.6" (1.844 m)    Wt 228 lb 9.6 oz (103.7 kg)    SpO2 98%    BMI 30.49 kg/m   Physical Exam Nursing note reviewed.  Constitutional:      General: He is not in acute distress.    Appearance: Normal appearance. He is normal weight. He is not ill-appearing, toxic-appearing or diaphoretic.  HENT:     Head: Normocephalic and atraumatic.     Right Ear: Tympanic membrane, ear canal and external ear normal. There is no impacted cerumen.     Left Ear: Tympanic membrane, ear canal and external ear normal. There is no impacted cerumen.     Nose: Nose normal. No congestion or rhinorrhea.     Mouth/Throat:     Mouth: Mucous membranes are moist.     Pharynx: Oropharynx is clear. No oropharyngeal exudate or posterior oropharyngeal erythema.  Eyes:     General: No scleral icterus.       Right eye: No discharge.         Left eye: No discharge.     Extraocular Movements: Extraocular movements intact.     Conjunctiva/sclera: Conjunctivae normal.     Pupils: Pupils are equal, round, and reactive to light.  Neck:     Vascular: No carotid bruit.  Cardiovascular:     Rate and Rhythm: Normal rate and regular rhythm.     Pulses: Normal pulses.     Heart sounds: Normal heart sounds. No murmur heard.  No friction rub. No gallop.   Pulmonary:     Effort: Pulmonary effort is normal. No respiratory distress.     Breath sounds: Normal breath sounds. No stridor. No wheezing, rhonchi or rales.  Chest:     Chest wall: No tenderness.  Abdominal:     General: Abdomen is flat. Bowel sounds are normal. There is no distension.     Palpations: Abdomen is soft. There is no mass.     Tenderness: There is no abdominal tenderness. There is no right CVA tenderness, left CVA tenderness, guarding or rebound.     Hernia: No hernia is present.  Musculoskeletal:        General: No swelling, tenderness, deformity or signs of injury. Normal range of motion.     Cervical back: Normal range of motion and neck supple. No rigidity or tenderness.     Right lower leg: No edema.     Left lower leg: No edema.  Lymphadenopathy:     Cervical: No cervical adenopathy.  Skin:    General: Skin is warm and dry.     Capillary Refill: Capillary refill takes less than 2 seconds.     Coloration: Skin is not jaundiced or pale.     Findings: No bruising, erythema, lesion or rash.  Neurological:     General: No focal deficit present.     Mental Status: He is alert and oriented to person, place, and time. Mental status is at baseline.     Cranial Nerves: No cranial nerve deficit.     Sensory: No sensory deficit.     Motor: No weakness.     Coordination: Coordination normal.     Gait: Gait normal.     Deep Tendon Reflexes: Reflexes normal.  Psychiatric:        Mood and Affect: Mood normal.        Behavior: Behavior normal.        Thought  Content: Thought content normal.        Judgment:  Judgment normal.     Assessment & Plan:   Problem List Items Addressed This Visit    None    Visit Diagnoses    Annual physical exam    -  Primary   Screening for endocrine, metabolic and immunity disorder       Relevant Orders   TSH (Completed)   Comprehensive metabolic panel (Completed)   CBC with Differential (Completed)   Hemoglobin A1c (Completed)   Lipid screening       Relevant Orders   Lipid panel (Completed)   Need for diphtheria-tetanus-pertussis (Tdap) vaccine       Relevant Orders   Tdap vaccine greater than or equal to 7yo IM (Completed)   Screening for viral disease       Relevant Orders   Hepatitis C antibody (Completed)   HIV antibody (with reflex) (Completed)   Encounter to establish care       Paresthesia       Relevant Orders   Vitamin D, 25-hydroxy (Completed)   Vitamin B12 (Completed)   Magnesium (Completed)   Dryness of both ear canals       Relevant Medications   acetic acid-hydrocortisone (VOSOL-HC) OTIC solution      Outpatient Encounter Medications as of 05/18/2020  Medication Sig   acetic acid-hydrocortisone (VOSOL-HC) OTIC solution Place 4 drops into both ears 3 (three) times daily. (Patient not taking: Reported on 07/12/2020)   [DISCONTINUED] amoxicillin (AMOXIL) 875 MG tablet Take 1 tablet (875 mg total) by mouth 2 (two) times daily. (Patient not taking: Reported on 05/18/2020)   [DISCONTINUED] gentamicin (GARAMYCIN) 0.3 % ophthalmic solution Place 2 drops into the right eye every 4 (four) hours. (Patient not taking: Reported on 03/18/2016)   No facility-administered encounter medications on file as of 05/18/2020.    Follow-up: No follow-ups on file.   PLAN  Exam unremarkable  Labs collected, will follow up as warranted  Suspect paresthesia is vitamin deficiency  vosol drops for ears - use tid PRN  Return annually unless labs dictate otherwise  Patient encouraged to call clinic with  any questions, comments, or concerns.  Janeece Agee, NP

## 2020-09-08 DIAGNOSIS — G5603 Carpal tunnel syndrome, bilateral upper limbs: Secondary | ICD-10-CM | POA: Diagnosis not present

## 2020-09-08 DIAGNOSIS — G5602 Carpal tunnel syndrome, left upper limb: Secondary | ICD-10-CM | POA: Diagnosis not present

## 2020-09-08 DIAGNOSIS — G5601 Carpal tunnel syndrome, right upper limb: Secondary | ICD-10-CM | POA: Diagnosis not present

## 2020-09-20 NOTE — Progress Notes (Signed)
[facility]    Provider:  Dr Lucia Gaskins Requesting Provider: Peyton Najjar, MD, Janace Hoard, MD Primary Care Provider:  Janeece Agee, NP, Janace Hoard, MD  CC:  Numbness right outer thigh  HPI:  Brett Cook is a 49 y.o. male here as requested by Peyton Najjar, MD for paresthesias of right leg.  I reviewed records: Past medical history foot pain bilateral, plantar fasciitis and a recent tick bite for which he saw Dr. Luciana Axe in the [facility] infectious disease center.  He saw Dr. Gerlene Burdock Ramo's in August of this year however I do not have access to those notes, for numbness of hand and degeneration of cervical intervertebral disc, appears he was also seen in September 2021 Dr. Horald Chestnut and diagnosed with carpal tunnel syndrome of both hands.  I reviewed notes from Dr. Alwyn Ren July 2021 patient reported numbness of the right thigh over the last year.  I reviewed Orlie Dakin notes: Patient was seen in June, at that time he reported the bilateral paresthesias in the upper extremities worse in the hands for which she was evaluated by Dr. Ethelene Hal (see above), at that time he did not mention his right leg paresthesias.  He has been seeing Dr. Ethelene Hal and had some injections and feel better. Nerve blocks have been good in the shoulder and wrists. For about a year he has had numbness right thigh, feels it when he touches it, no pain, he a thorough evaluation with Dr. Kateri Plummer. He did not have a Lyme diagnosis he saw infectious disease. For the thigh numbness no injury or inciting, no pain, just feels asleep when touching, nothing makes it better or worse, continuous, his home is ergonomic, no low back pain currently but he has had low back pain in the past, no radicular symptoms, no weakness. He walks every day. He has gained weight. Stable. No other focal neurologic deficits, associated symptoms, inciting events or modifiable factors.  Reviewed notes, labs and imaging from outside physicians,  which showed:  I reviewed MRI of the cervical spine images patient brought on CD with him today, and reviewed report and agree with below: C3-C4 moderate to moderate severe right and mild to moderate left foraminal stenosis, C4-C5 moderate severe by foraminal stenosis, C5-C6 moderate severe right foraminal stenosis, C6-C7 moderate left and moderate to severe right foraminal stenosis, C7-T1 mild/moderate right and mild/moderate to moderate left foraminal stenosis.  May 18, 2020: B12 917, hemoglobin A1c 5.3  Review of Systems: Patient complains of symptoms per HPI as well as the following symptoms: numbness. Pertinent negatives and positives per HPI. All others negative.   Social History   Socioeconomic History  . Marital status: Married    Spouse name: Not on file  . Number of children: 2  . Years of education: Not on file  . Highest education level: Not on file  Occupational History  . Not on file  Tobacco Use  . Smoking status: Never Smoker  . Smokeless tobacco: Never Used  Vaping Use  . Vaping Use: Never used  Substance and Sexual Activity  . Alcohol use: Yes    Alcohol/week: 0.0 standard drinks    Comment: social use; glass of wine "here and there"  . Drug use: No  . Sexual activity: Yes  Other Topics Concern  . Not on file  Social History Narrative   Lives at home with family    Left handed   Caffeine: none    Social Determinants of Health   Financial  Resource Strain:   . Difficulty of Paying Living Expenses: Not on file  Food Insecurity:   . Worried About Programme researcher, broadcasting/film/video in the Last Year: Not on file  . Ran Out of Food in the Last Year: Not on file  Transportation Needs:   . Lack of Transportation (Medical): Not on file  . Lack of Transportation (Non-Medical): Not on file  Physical Activity:   . Days of Exercise per Week: Not on file  . Minutes of Exercise per Session: Not on file  Stress:   . Feeling of Stress : Not on file  Social Connections:   .  Frequency of Communication with Friends and Family: Not on file  . Frequency of Social Gatherings with Friends and Family: Not on file  . Attends Religious Services: Not on file  . Active Member of Clubs or Organizations: Not on file  . Attends Banker Meetings: Not on file  . Marital Status: Not on file  Intimate Partner Violence:   . Fear of Current or Ex-Partner: Not on file  . Emotionally Abused: Not on file  . Physically Abused: Not on file  . Sexually Abused: Not on file    Family History  Problem Relation Age of Onset  . Alzheimer's disease Mother   . Lupus Mother   . Heart disease Father   . Neuropathy Neg Hx     Past Medical History:  Diagnosis Date  . Allergy     Patient Active Problem List   Diagnosis Date Noted  . Meralgia paresthetica of right side 09/21/2020  . Tick bite of unknown location 07/12/2020  . PLANTAR FASCIITIS 07/10/2010  . FOOT PAIN, BILATERAL 07/10/2010  . TALIPES CAVUS 07/10/2010    Past Surgical History:  Procedure Laterality Date  . SHOULDER SURGERY    . VASECTOMY N/A    [employer]reesia 05/16/2020    No current outpatient medications on file.   No current facility-administered medications for this visit.    Allergies as of 09/21/2020 - Review Complete 09/21/2020  Allergen Reaction Noted  . Penicillins  12/25/2012    Vitals: BP 122/82 (BP Location: Left Arm, Patient Position: Sitting)   Pulse 67   Ht 6' (1.829 m)   Wt 226 lb (102.5 kg)   BMI 30.65 kg/m  Last Weight:  Wt Readings from Last 1 Encounters:  09/21/20 226 lb (102.5 kg)   Last Height:   Ht Readings from Last 1 Encounters:  09/21/20 6' (1.829 m)     Physical exam: Exam: Gen: NAD, conversant, well nourised, obese, well groomed                     CV: RRR, no MRG. No Carotid Bruits. No peripheral edema, warm, nontender Eyes: Conjunctivae clear without exudates or hemorrhage  Neuro: Detailed Neurologic Exam  Speech:    Speech is normal; fluent and  spontaneous with normal comprehension.  Cognition:    The patient is oriented to person, place, and time;     recent and remote memory intact;     language fluent;     normal attention, concentration,     fund of knowledge Cranial Nerves:    The pupils are equal, round, and reactive to light. The fundi are flat. Visual fields are full to finger confrontation. Extraocular movements are intact. Trigeminal sensation is intact and the muscles of mastication are normal. The face is symmetric. The palate elevates in the midline. Hearing intact. Voice is normal. Shoulder  shrug is normal. The tongue has normal motion without fasciculations.   Coordination:    No ataxia or dysmetria   Gait:    Normal native gait  Motor Observation:    No asymmetry, no atrophy, and no involuntary movements noted. Tone:    Normal muscle tone.    Posture:    Posture is normal. normal erect    Strength:    Strength is V/V in the upper and lower limbs.      Sensation: intact to LT     Reflex Exam:  DTR's:    Deep tendon reflexes in the upper and lower extremities are normal bilaterally.   Toes:    The toes are downgoing bilaterally.   Clonus:    Clonus is absent.    Assessment/Plan:  49 year old with likely Meralgia Paresthetica  - Meralgia Paresthetica (Lateral Femoral Cutaneous Nerve): Discussed and reviewed online images. - Can try stretching exercises on the internet, weight loss and not sitting in one place for long, wear loose clothes. Can consider nerve block with Dr. Ethelene Hal in the future if needed.   We discussed conservative measures and MRI of the lumbar spine. We will monitor clinically.     Cc: Peyton Najjar, MD,  Janeece Agee, NP  Naomie Dean, MD  [facility] [address] [place], [place] [address]  Phone [phone] Fax [phone]

## 2020-09-21 ENCOUNTER — Ambulatory Visit: Payer: BC Managed Care – PPO | Admitting: Neurology

## 2020-09-21 ENCOUNTER — Encounter: Payer: Self-pay | Admitting: Neurology

## 2020-09-21 VITALS — BP 122/82 | HR 67 | Ht 72.0 in | Wt 226.0 lb

## 2020-09-21 DIAGNOSIS — G5711 Meralgia paresthetica, right lower limb: Secondary | ICD-10-CM

## 2020-09-21 NOTE — Patient Instructions (Addendum)
  Meralgia Paresthetica (Lateral Femoral Cutaneous Nerve) Can try stretching exercises on the internet, weight loss and not sitting in one place for long, wear loose clothes

## 2020-10-14 DIAGNOSIS — E041 Nontoxic single thyroid nodule: Secondary | ICD-10-CM | POA: Diagnosis not present

## 2020-10-18 ENCOUNTER — Other Ambulatory Visit: Payer: Self-pay | Admitting: Surgery

## 2020-10-18 DIAGNOSIS — E041 Nontoxic single thyroid nodule: Secondary | ICD-10-CM

## 2020-10-20 ENCOUNTER — Ambulatory Visit
Admission: RE | Admit: 2020-10-20 | Discharge: 2020-10-20 | Disposition: A | Discharge status: Home / Self Care | Payer: BC Managed Care – PPO | Source: Ambulatory Visit | Attending: Surgery | Admitting: Surgery

## 2020-10-20 ENCOUNTER — Other Ambulatory Visit (HOSPITAL_COMMUNITY)
Admission: RE | Admit: 2020-10-20 | Discharge: 2020-10-20 | Disposition: A | Discharge status: Home / Self Care | Payer: BC Managed Care – PPO | Source: Ambulatory Visit | Attending: Interventional Radiology | Admitting: Interventional Radiology

## 2020-10-20 DIAGNOSIS — E041 Nontoxic single thyroid nodule: Secondary | ICD-10-CM | POA: Diagnosis not present

## 2020-10-20 DIAGNOSIS — R896 Abnormal cytological findings in specimens from other organs, systems and tissues: Secondary | ICD-10-CM | POA: Diagnosis not present

## 2020-10-21 LAB — CYTOLOGY - NON PAP

## 2020-10-28 DIAGNOSIS — E041 Nontoxic single thyroid nodule: Secondary | ICD-10-CM | POA: Diagnosis not present

## 2020-11-07 DIAGNOSIS — M79641 Pain in right hand: Secondary | ICD-10-CM | POA: Diagnosis not present

## 2020-11-07 DIAGNOSIS — M5030 Other cervical disc degeneration, unspecified cervical region: Secondary | ICD-10-CM | POA: Diagnosis not present

## 2020-11-07 DIAGNOSIS — G5603 Carpal tunnel syndrome, bilateral upper limbs: Secondary | ICD-10-CM | POA: Diagnosis not present

## 2020-11-08 ENCOUNTER — Encounter (HOSPITAL_COMMUNITY): Payer: Self-pay

## 2020-11-10 IMAGING — US US THYROID
1 series · 13 of 25 positions shown · non-contrast
Comparison: None available

CLINICAL DATA: Goiter noted on cervical MRI

EXAM:
THYROID ULTRASOUND
TECHNIQUE: Ultrasound examination of the thyroid gland and adjacent soft
tissues was performed.

[Series 1: us thyroid · 0.07mm/px · 13 of 42 slices shown]
[im 1/42]
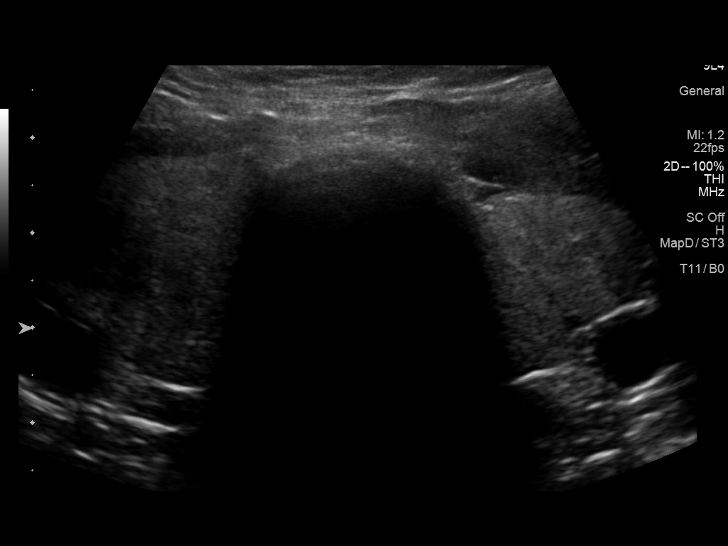
[im 4/42]
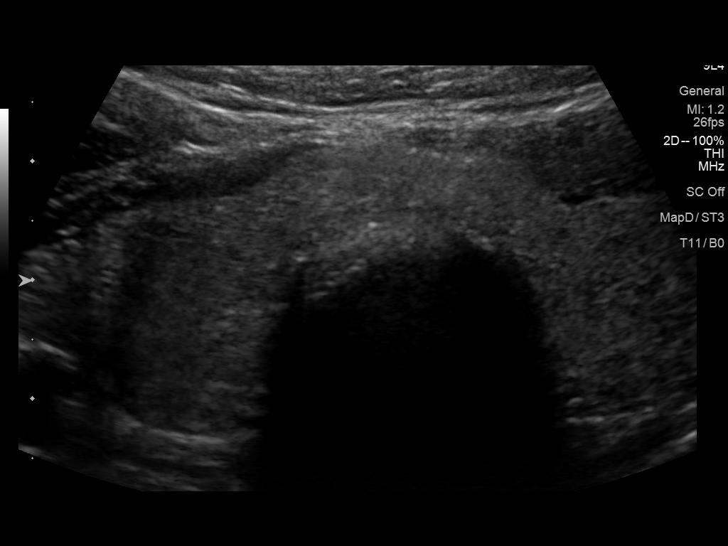
[im 7/42]
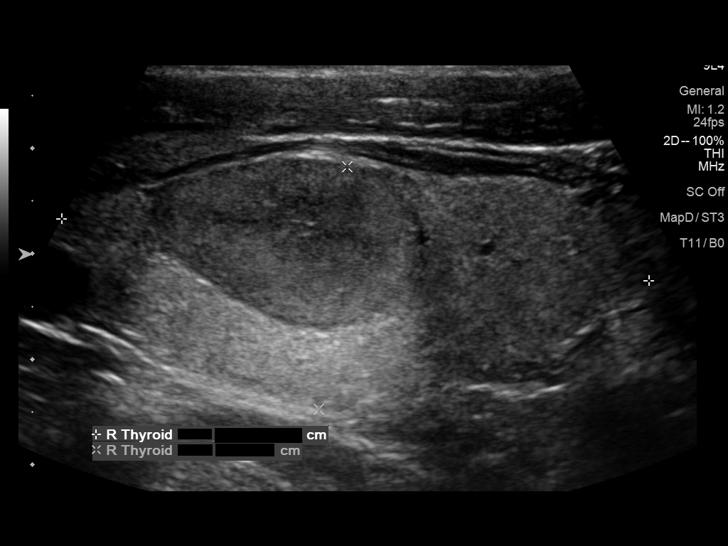
[im 11/42]
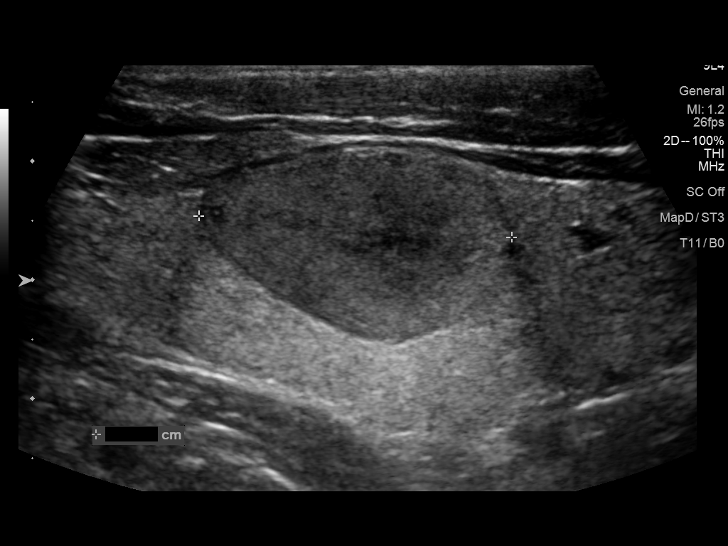
[im 14/42]
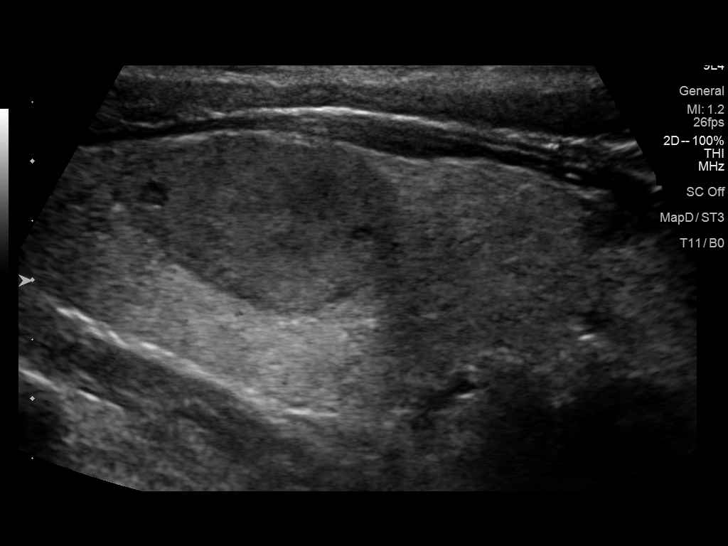
[im 18/42]
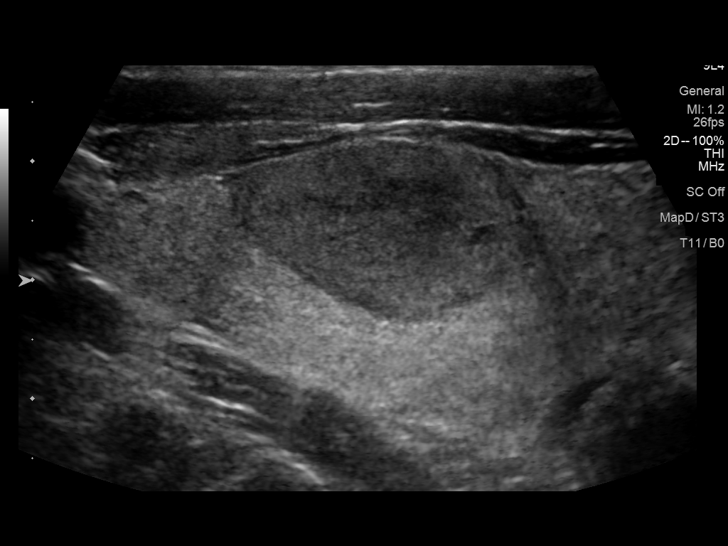
[im 21/42]
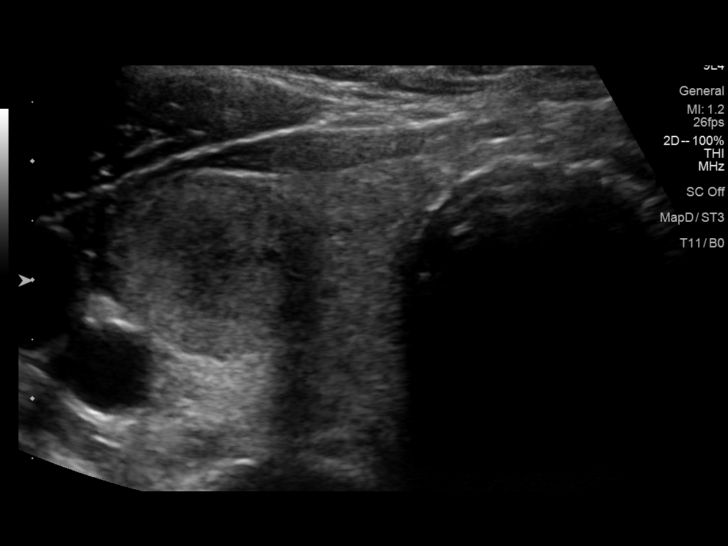
[im 24/42]
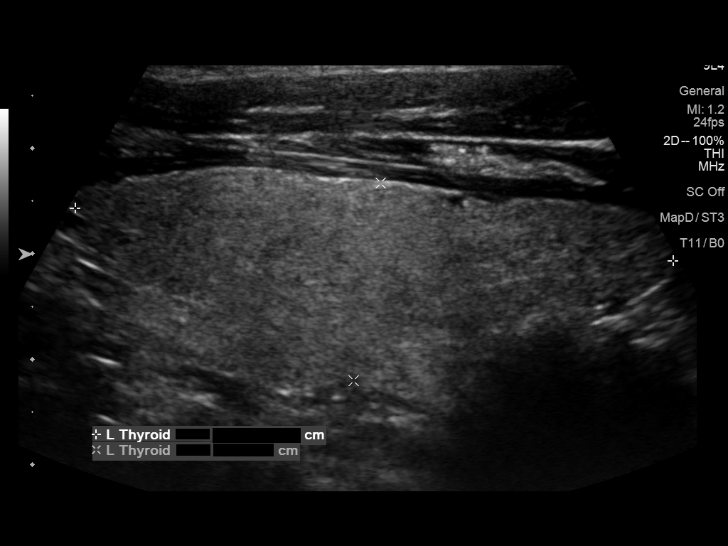
[im 28/42]
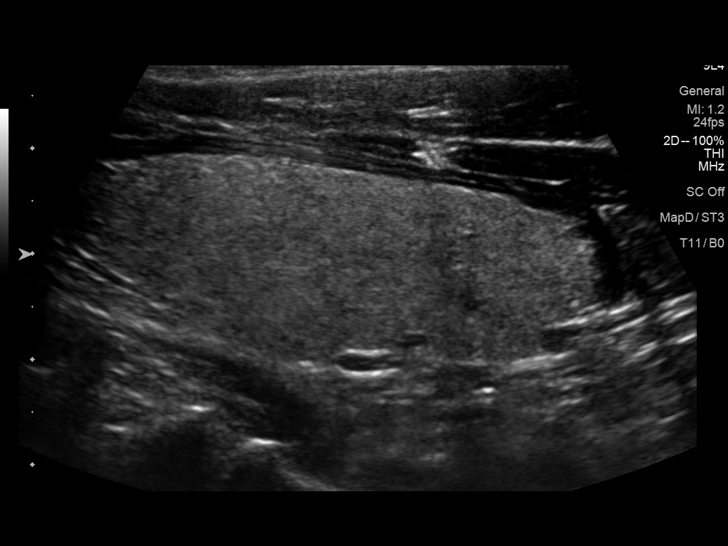
[im 31/42]
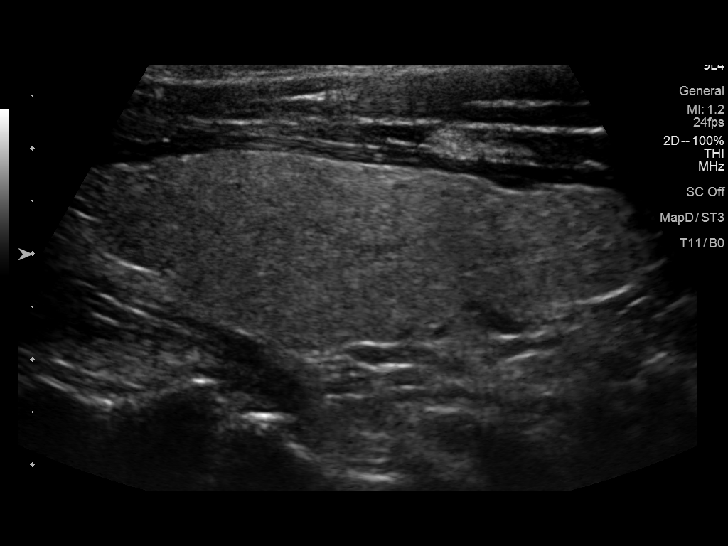
[im 35/42]
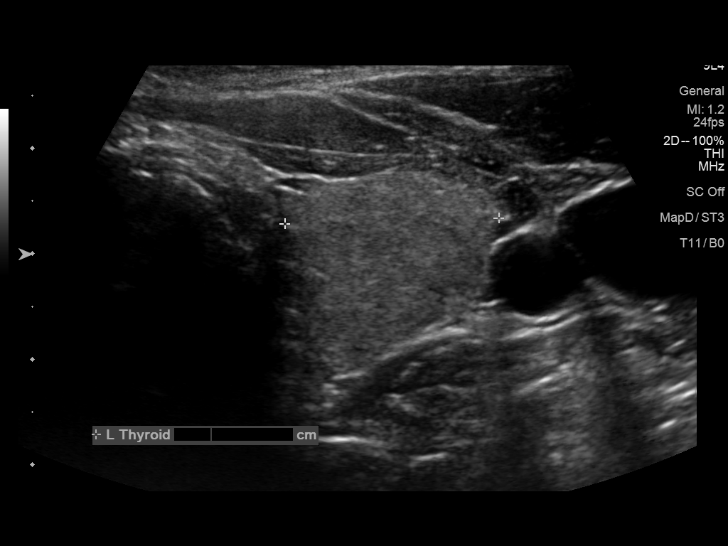
[im 38/42]
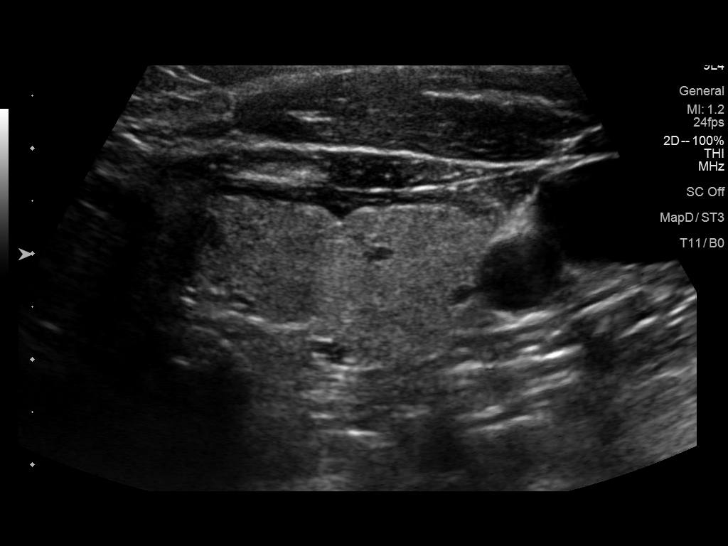
[im 42/42]
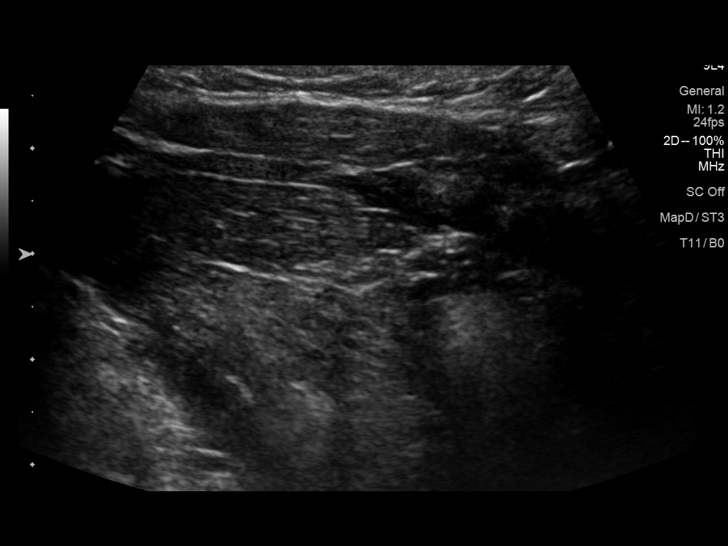

[13 of 25 positions shown; findings below may reference images not displayed]

FINDINGS: Parenchymal Echotexture: Mildly heterogenous

Isthmus: 0.6 cm thickness

Right lobe: 5.6 x 2.3 x 2.5 cm

Left lobe: 5.7 x 1.9 x 2 cm

_________________________________________________________

Estimated total number of nodules >/= 1 cm: 1

Number of spongiform nodules >/=  2 cm not described below (TR1): 0

Number of mixed cystic and solid nodules >/= 1.5 cm not described
below (TR2): 0

_________________________________________________________

Nodule # 1:

Location: Right; Mid

Maximum size: 2.6 cm; Other 2 dimensions: 1.8 x 1.5 cm

Composition: solid/almost completely solid (2)

Echogenicity: isoechoic (1)

Shape: not taller-than-wide (0)

Margins: smooth (0)

Echogenic foci: none (0)

ACR TI-RADS total points: 3.

ACR TI-RADS risk category: TR3 (3 points).

ACR TI-RADS recommendations:

**Given size (>/= 2.5 cm) and appearance, fine needle aspiration of
this mildly suspicious nodule should be considered based on TI-RADS
criteria.

_________________________________________________________
IMPRESSION: 1. Thyromegaly with solitary mildly suspicious 2.6 cm mid right
nodule. Recommend FNA biopsy.

The above is in keeping with the ACR TI-RADS recommendations - [HOSPITAL] 5515;[DATE].

## 2021-01-27 IMAGING — US US FNA BIOPSY THYROID 1ST LESION
1 series · 13 of 13 positions shown · non-contrast
Comparison: Prior thyroid ultrasound 08/03/2020

MEDICATIONS:
None

COMPLICATIONS:
None immediate.

INDICATION: Indeterminate thyroid nodule

EXAM:
ULTRASOUND GUIDED FINE NEEDLE ASPIRATION OF INDETERMINATE THYROID
NODULE
TECHNIQUE: Informed written consent was obtained from the patient after a
discussion of the risks, benefits and alternatives to treatment.
Questions regarding the procedure were encouraged and answered. A
timeout was performed prior to the initiation of the procedure.

[Series 1: us fna biopsy thyroid 1st lesion · 0.05mm/px · 13 acquisitions, 13 frames shown]
[im 1/13]
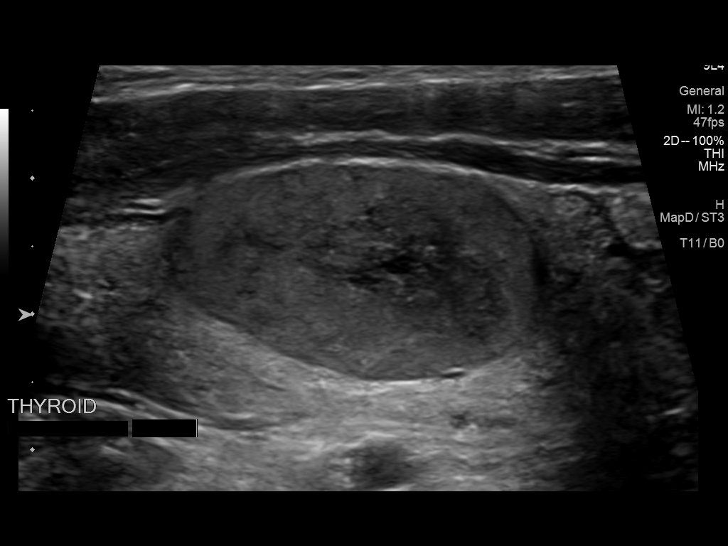
[im 2/13]
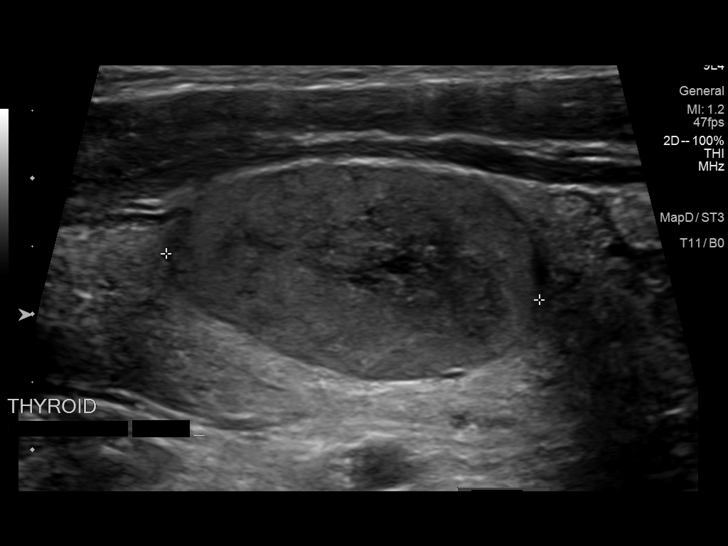
[im 3/13]
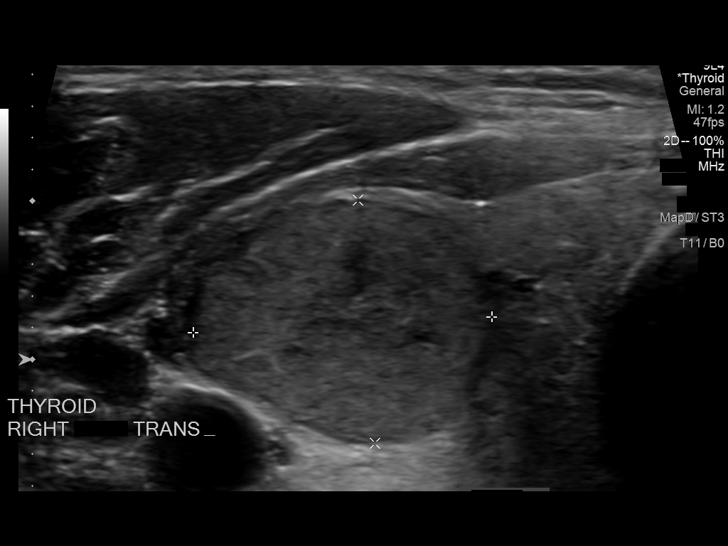
[im 4/13]
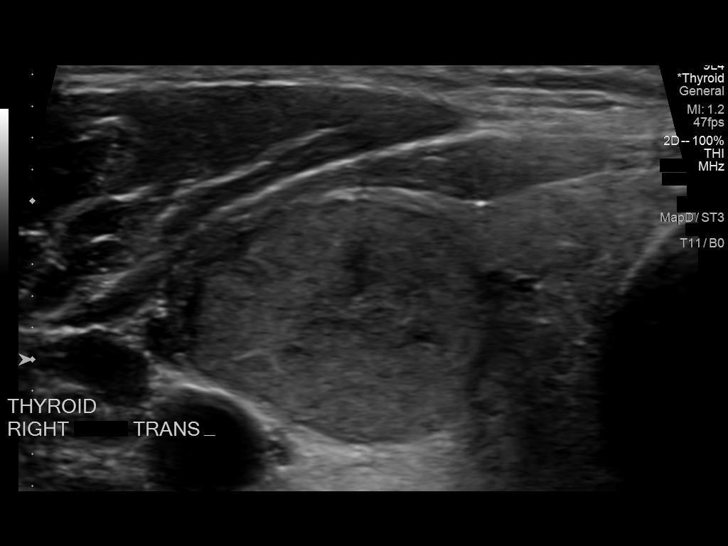
[im 5/13]
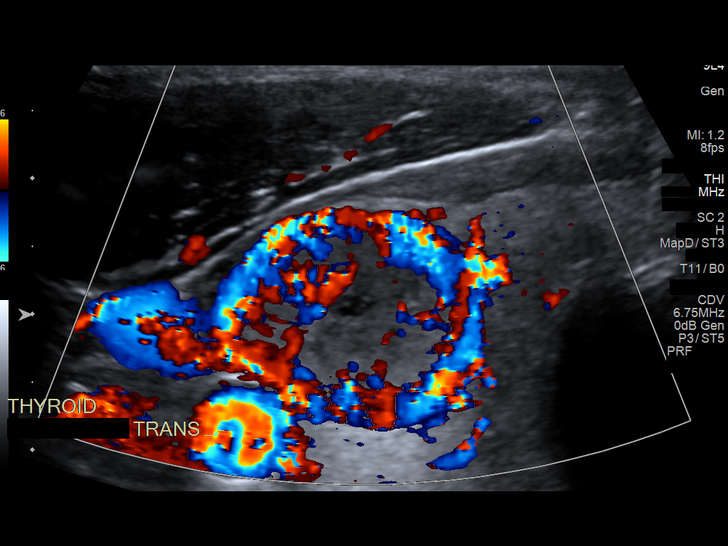
[im 6/13]
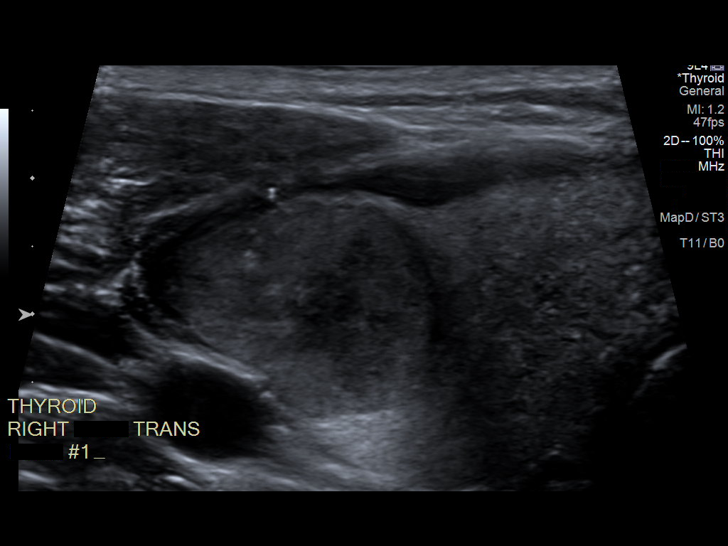
[im 7/13]
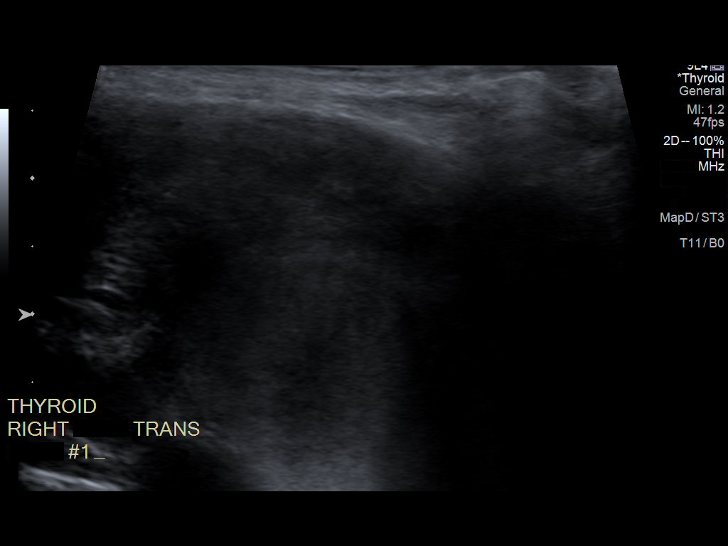
[im 8/13]
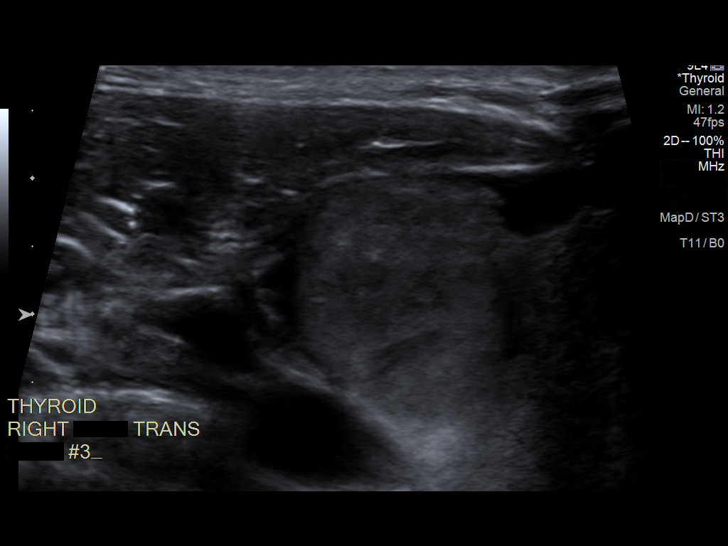
[im 9/13]
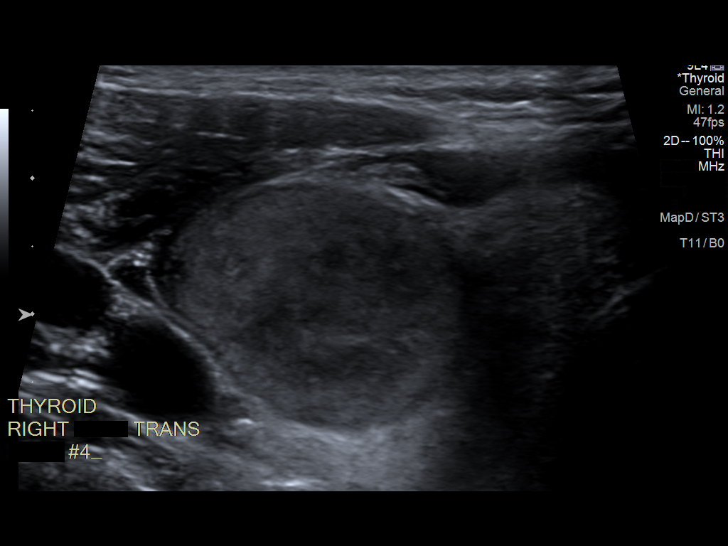
[im 10/13]
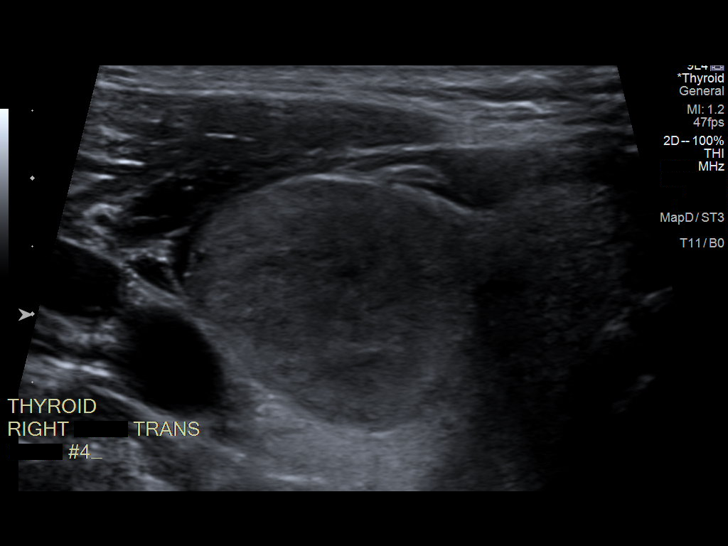
[im 11/13]
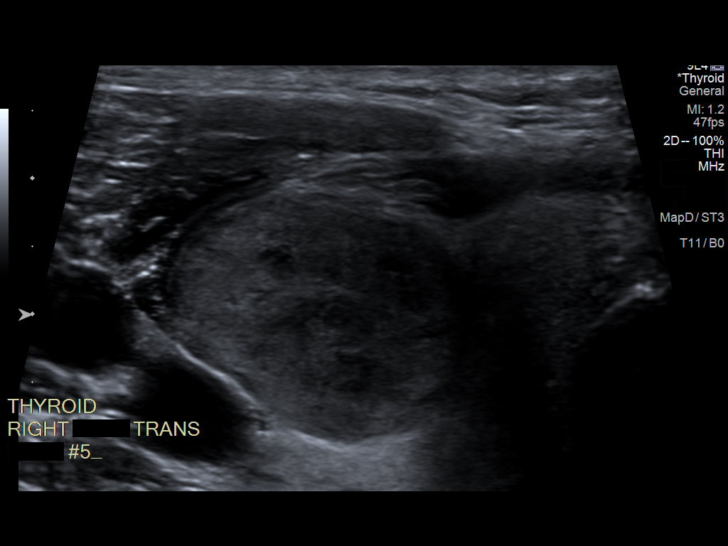
[im 12/13]
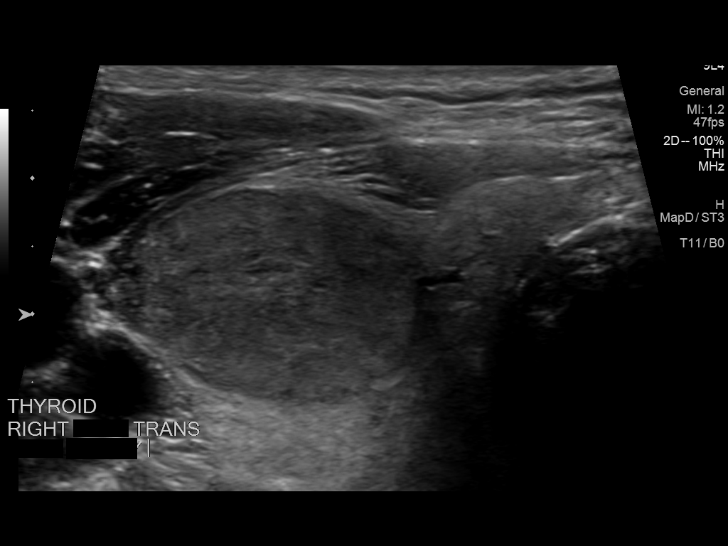
[im 13/13]
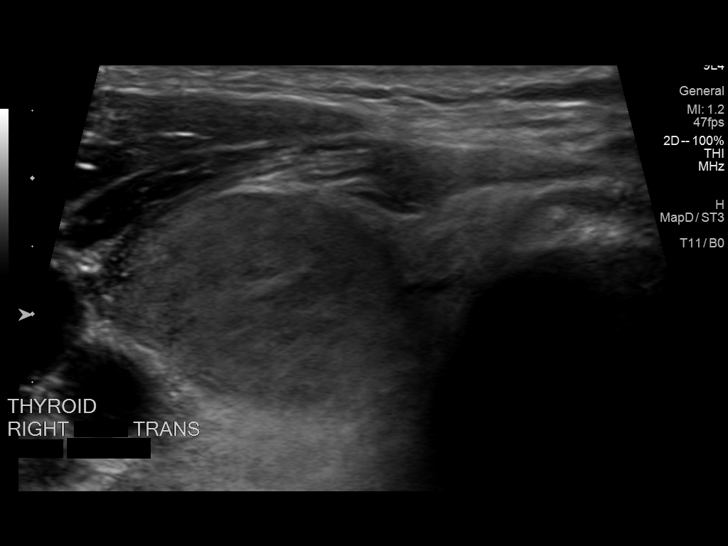

[13 of 13 positions shown; findings below may reference images not displayed]

Pre-procedural ultrasound scanning demonstrated unchanged size and
appearance of the indeterminate nodule within the right mid gland

The procedure was planned. The neck was prepped in the usual sterile
fashion, and a sterile drape was applied covering the operative
field. A timeout was performed prior to the initiation of the
procedure. Local anesthesia was provided with 1% lidocaine.

Under direct ultrasound guidance, 5 FNA biopsies were performed of
the nodule with a 25 gauge needle. Two samples were reserved for
Afirma testing. Multiple ultrasound images were saved for procedural
documentation purposes. The samples were prepared and submitted to
pathology.

Limited post procedural scanning was negative for hematoma or
additional complication. Dressings were placed. The patient
tolerated the above procedures procedure well without immediate
postprocedural complication.
FINDINGS: Nodule reference number based on prior diagnostic ultrasound: 1

Maximum size: 2.6 cm

Location: Right; Mid

ACR TI-RADS risk category: TR3 (3 points)

Reason for biopsy: meets ACR TI-RADS criteria

Ultrasound imaging confirms appropriate placement of the needles
within the thyroid nodule.
IMPRESSION: Technically successful ultrasound guided fine needle aspiration of
right mid thyroid nodule.

## 2021-09-22 DIAGNOSIS — H903 Sensorineural hearing loss, bilateral: Secondary | ICD-10-CM | POA: Diagnosis not present

## 2021-09-22 DIAGNOSIS — H9311 Tinnitus, right ear: Secondary | ICD-10-CM | POA: Diagnosis not present

## 2021-11-13 DIAGNOSIS — E041 Nontoxic single thyroid nodule: Secondary | ICD-10-CM | POA: Diagnosis not present

## 2022-02-28 DIAGNOSIS — Z0000 Encounter for general adult medical examination without abnormal findings: Secondary | ICD-10-CM | POA: Diagnosis not present

## 2022-04-18 DIAGNOSIS — H9311 Tinnitus, right ear: Secondary | ICD-10-CM | POA: Diagnosis not present

## 2022-04-18 DIAGNOSIS — H9041 Sensorineural hearing loss, unilateral, right ear, with unrestricted hearing on the contralateral side: Secondary | ICD-10-CM | POA: Diagnosis not present

## 2022-05-11 DIAGNOSIS — E785 Hyperlipidemia, unspecified: Secondary | ICD-10-CM | POA: Diagnosis not present

## 2022-05-11 DIAGNOSIS — Z125 Encounter for screening for malignant neoplasm of prostate: Secondary | ICD-10-CM | POA: Diagnosis not present

## 2022-05-11 DIAGNOSIS — E041 Nontoxic single thyroid nodule: Secondary | ICD-10-CM | POA: Diagnosis not present

## 2022-05-15 DIAGNOSIS — E041 Nontoxic single thyroid nodule: Secondary | ICD-10-CM | POA: Diagnosis not present

## 2022-05-16 ENCOUNTER — Other Ambulatory Visit: Payer: Self-pay | Admitting: Internal Medicine

## 2022-05-16 DIAGNOSIS — Z1331 Encounter for screening for depression: Secondary | ICD-10-CM | POA: Diagnosis not present

## 2022-05-16 DIAGNOSIS — Z0000 Encounter for general adult medical examination without abnormal findings: Secondary | ICD-10-CM

## 2022-05-16 DIAGNOSIS — Z1339 Encounter for screening examination for other mental health and behavioral disorders: Secondary | ICD-10-CM | POA: Diagnosis not present

## 2022-05-16 DIAGNOSIS — E785 Hyperlipidemia, unspecified: Secondary | ICD-10-CM

## 2022-05-16 DIAGNOSIS — E041 Nontoxic single thyroid nodule: Secondary | ICD-10-CM | POA: Diagnosis not present

## 2022-06-06 DIAGNOSIS — L718 Other rosacea: Secondary | ICD-10-CM | POA: Diagnosis not present

## 2022-06-06 DIAGNOSIS — I788 Other diseases of capillaries: Secondary | ICD-10-CM | POA: Diagnosis not present

## 2022-06-06 DIAGNOSIS — L400 Psoriasis vulgaris: Secondary | ICD-10-CM | POA: Diagnosis not present

## 2022-07-05 ENCOUNTER — Ambulatory Visit
Admission: RE | Admit: 2022-07-05 | Discharge: 2022-07-05 | Disposition: A | Discharge status: Home / Self Care | Payer: No Typology Code available for payment source | Source: Ambulatory Visit | Attending: Internal Medicine | Admitting: Internal Medicine

## 2022-07-05 DIAGNOSIS — Z0000 Encounter for general adult medical examination without abnormal findings: Secondary | ICD-10-CM

## 2022-07-05 DIAGNOSIS — E785 Hyperlipidemia, unspecified: Secondary | ICD-10-CM | POA: Diagnosis not present

## 2022-09-06 DIAGNOSIS — E785 Hyperlipidemia, unspecified: Secondary | ICD-10-CM | POA: Diagnosis not present

## 2022-09-06 DIAGNOSIS — E041 Nontoxic single thyroid nodule: Secondary | ICD-10-CM | POA: Diagnosis not present

## 2022-10-03 DIAGNOSIS — M25512 Pain in left shoulder: Secondary | ICD-10-CM | POA: Diagnosis not present

## 2022-12-17 DIAGNOSIS — M25512 Pain in left shoulder: Secondary | ICD-10-CM | POA: Diagnosis not present

## 2023-01-06 DIAGNOSIS — M25561 Pain in right knee: Secondary | ICD-10-CM | POA: Diagnosis not present

## 2023-01-09 DIAGNOSIS — M75122 Complete rotator cuff tear or rupture of left shoulder, not specified as traumatic: Secondary | ICD-10-CM | POA: Diagnosis not present

## 2023-02-06 DIAGNOSIS — H9311 Tinnitus, right ear: Secondary | ICD-10-CM | POA: Diagnosis not present

## 2023-02-06 DIAGNOSIS — H9041 Sensorineural hearing loss, unilateral, right ear, with unrestricted hearing on the contralateral side: Secondary | ICD-10-CM | POA: Diagnosis not present

## 2023-02-11 DIAGNOSIS — M19112 Post-traumatic osteoarthritis, left shoulder: Secondary | ICD-10-CM | POA: Diagnosis not present

## 2023-02-25 DIAGNOSIS — M25512 Pain in left shoulder: Secondary | ICD-10-CM | POA: Diagnosis not present

## 2023-02-25 DIAGNOSIS — M75122 Complete rotator cuff tear or rupture of left shoulder, not specified as traumatic: Secondary | ICD-10-CM | POA: Diagnosis not present

## 2023-03-24 DIAGNOSIS — Z1212 Encounter for screening for malignant neoplasm of rectum: Secondary | ICD-10-CM | POA: Diagnosis not present

## 2023-03-24 DIAGNOSIS — Z1211 Encounter for screening for malignant neoplasm of colon: Secondary | ICD-10-CM | POA: Diagnosis not present

## 2023-05-15 DIAGNOSIS — Z125 Encounter for screening for malignant neoplasm of prostate: Secondary | ICD-10-CM | POA: Diagnosis not present

## 2023-05-15 DIAGNOSIS — E041 Nontoxic single thyroid nodule: Secondary | ICD-10-CM | POA: Diagnosis not present

## 2023-05-15 DIAGNOSIS — E785 Hyperlipidemia, unspecified: Secondary | ICD-10-CM | POA: Diagnosis not present

## 2023-05-22 DIAGNOSIS — Z1339 Encounter for screening examination for other mental health and behavioral disorders: Secondary | ICD-10-CM | POA: Diagnosis not present

## 2023-05-22 DIAGNOSIS — Z0000 Encounter for general adult medical examination without abnormal findings: Secondary | ICD-10-CM | POA: Diagnosis not present

## 2023-05-22 DIAGNOSIS — E041 Nontoxic single thyroid nodule: Secondary | ICD-10-CM | POA: Diagnosis not present

## 2023-05-22 DIAGNOSIS — Z1331 Encounter for screening for depression: Secondary | ICD-10-CM | POA: Diagnosis not present

## 2023-07-29 DIAGNOSIS — K1370 Unspecified lesions of oral mucosa: Secondary | ICD-10-CM | POA: Diagnosis not present

## 2023-09-05 DIAGNOSIS — M272 Inflammatory conditions of jaws: Secondary | ICD-10-CM | POA: Diagnosis not present

## 2023-09-05 DIAGNOSIS — K090 Developmental odontogenic cysts: Secondary | ICD-10-CM | POA: Diagnosis not present

## 2023-09-05 DIAGNOSIS — K136 Irritative hyperplasia of oral mucosa: Secondary | ICD-10-CM | POA: Diagnosis not present

## 2023-12-30 DIAGNOSIS — G5603 Carpal tunnel syndrome, bilateral upper limbs: Secondary | ICD-10-CM | POA: Diagnosis not present

## 2024-02-25 ENCOUNTER — Ambulatory Visit (INDEPENDENT_AMBULATORY_CARE_PROVIDER_SITE_OTHER): Payer: BC Managed Care – PPO

## 2024-02-25 ENCOUNTER — Ambulatory Visit (INDEPENDENT_AMBULATORY_CARE_PROVIDER_SITE_OTHER): Admitting: Otolaryngology

## 2024-02-25 ENCOUNTER — Encounter (INDEPENDENT_AMBULATORY_CARE_PROVIDER_SITE_OTHER): Payer: Self-pay

## 2024-02-25 VITALS — BP 135/79 | HR 73 | Ht 72.0 in | Wt 225.0 lb

## 2024-02-25 DIAGNOSIS — H903 Sensorineural hearing loss, bilateral: Secondary | ICD-10-CM

## 2024-02-25 DIAGNOSIS — H9319 Tinnitus, unspecified ear: Secondary | ICD-10-CM

## 2024-02-25 DIAGNOSIS — H9311 Tinnitus, right ear: Secondary | ICD-10-CM

## 2024-02-26 DIAGNOSIS — H903 Sensorineural hearing loss, bilateral: Secondary | ICD-10-CM | POA: Insufficient documentation

## 2024-02-26 DIAGNOSIS — H9311 Tinnitus, right ear: Secondary | ICD-10-CM | POA: Insufficient documentation

## 2024-02-26 NOTE — Progress Notes (Signed)
 Patient ID: Brett Cook, male   DOB: 11-Oct-1971, 53 y.o.   MRN: [id number]  Follow-up: Right ear tinnitus, asymmetric hearing loss  HPI: The patient is a 53 year old male who returns today for his follow-up evaluation.  The patient was previously seen for right ear tinnitus.  He was found to have bilateral high-frequency sensorineural hearing loss, worse on the right side.  The strategies to cope with his tinnitus were discussed.  The small possibility of an acoustic neuroma or a retrocochlear lesion causing his asymmetric hearing loss and tinnitus was also discussed.  The patient elected to proceed with conservative observation.  The patient returns today complaining of intermittent right ear tinnitus.  He denies any otalgia, otorrhea, or vertigo. No other ENT, GI, or respiratory issue noted since the last visit.   Exam:  General: Communicates without difficulty, well nourished, no acute distress. Head: Normocephalic, no evidence injury, no tenderness, facial buttresses intact without stepoff. Eyes: PERRL, EOMI. No scleral icterus, conjunctivae clear. Neuro: CN II exam reveals vision grossly intact.  No nystagmus at any point of gaze. Ears: Auricles well formed without lesions.  Ear canals are intact without mass or lesion. No erythema or edema is appreciated.  The TMs are intact without fluid. Nose: External evaluation reveals normal support and skin without lesions.  Dorsum is intact.  Anterior rhinoscopy reveals healthy pink mucosa over anterior aspect of inferior turbinates and intact septum.  No purulence noted. Oral:  Oral cavity and oropharynx are intact, symmetric, without erythema or edema.  Mucosa is moist without lesions. Neck: Full range of motion without pain.  There is no significant lymphadenopathy.  No masses palpable.  Thyroid bed within normal limits to palpation.  Parotid glands and submandibular glands equal bilaterally without mass.  Trachea is midline. Neuro:  CN 2-12 grossly intact.  Gait normal. Vestibular: No nystagmus at any point of gaze. The cerebellar examination is unremarkable.   Assessment: 1. The patient's ear canals, tympanic membranes and middle ear spaces are normal.  2. Subjectively stable bilateral high-frequency sensorineural hearing loss, with asymmetry on the right. This may be secondary to his history of loud noise exposure.  3. The patient's tinnitus is likely secondary to his sensorineural hearing loss.   Plan:  1. The physical exam findings are reviewed with the patient.  2. The small possibilities of an acoustic neuroma or a retro-cochlear lesion causing the asymmetric hearing loss and tinnitus are discussed.  The options of conservative observation versus MRI scan are discussed.  The patient would like to proceed with conservative observation for now.  3. Strategies to cope with tinnitus, which include the use of masker, hearing aids, avoidance of caffeine and alcohol, and tinnitus retraining therapy are discussed with the patient.  4. The patient will follow up for re-evaluation in 12 months with repeat audiogram.

## 2024-06-23 DIAGNOSIS — L400 Psoriasis vulgaris: Secondary | ICD-10-CM | POA: Diagnosis not present

## 2024-06-23 DIAGNOSIS — D2339 Other benign neoplasm of skin of other parts of face: Secondary | ICD-10-CM | POA: Diagnosis not present

## 2024-09-04 DIAGNOSIS — E785 Hyperlipidemia, unspecified: Secondary | ICD-10-CM | POA: Diagnosis not present

## 2024-09-04 DIAGNOSIS — Z125 Encounter for screening for malignant neoplasm of prostate: Secondary | ICD-10-CM | POA: Diagnosis not present

## 2024-09-04 DIAGNOSIS — E041 Nontoxic single thyroid nodule: Secondary | ICD-10-CM | POA: Diagnosis not present

## 2024-09-25 DIAGNOSIS — E041 Nontoxic single thyroid nodule: Secondary | ICD-10-CM | POA: Diagnosis not present

## 2024-09-25 DIAGNOSIS — Z1331 Encounter for screening for depression: Secondary | ICD-10-CM | POA: Diagnosis not present

## 2024-09-25 DIAGNOSIS — Z23 Encounter for immunization: Secondary | ICD-10-CM | POA: Diagnosis not present

## 2024-09-25 DIAGNOSIS — Z0000 Encounter for general adult medical examination without abnormal findings: Secondary | ICD-10-CM | POA: Diagnosis not present

## 2024-09-25 DIAGNOSIS — Z1339 Encounter for screening examination for other mental health and behavioral disorders: Secondary | ICD-10-CM | POA: Diagnosis not present

## 2024-12-16 ENCOUNTER — Other Ambulatory Visit: Payer: Self-pay | Admitting: Internal Medicine

## 2024-12-16 DIAGNOSIS — E041 Nontoxic single thyroid nodule: Secondary | ICD-10-CM

## 2024-12-23 ENCOUNTER — Inpatient Hospital Stay
Admission: RE | Admit: 2024-12-23 | Discharge: 2024-12-23 | Disposition: A | Source: Ambulatory Visit | Attending: Internal Medicine | Admitting: Internal Medicine

## 2024-12-23 DIAGNOSIS — E041 Nontoxic single thyroid nodule: Secondary | ICD-10-CM
